# Patient Record
Sex: Male | Born: 1955 | Race: Black or African American | Hispanic: No | Marital: Single | Smoking: Former smoker
Health system: Southern US, Community
[De-identification: ages and names within clinical notes are randomized; demographics above are authoritative.]

## PROBLEM LIST (undated history)

## (undated) ENCOUNTER — Ambulatory Visit (HOSPITAL_COMMUNITY): Payer: Medicare HMO

## (undated) DIAGNOSIS — J45909 Unspecified asthma, uncomplicated: Secondary | ICD-10-CM

## (undated) DIAGNOSIS — J449 Chronic obstructive pulmonary disease, unspecified: Secondary | ICD-10-CM

## (undated) DIAGNOSIS — K7400 Hepatic fibrosis, unspecified: Secondary | ICD-10-CM

## (undated) DIAGNOSIS — K760 Fatty (change of) liver, not elsewhere classified: Secondary | ICD-10-CM

## (undated) DIAGNOSIS — D509 Iron deficiency anemia, unspecified: Secondary | ICD-10-CM

## (undated) DIAGNOSIS — F4310 Post-traumatic stress disorder, unspecified: Secondary | ICD-10-CM

## (undated) DIAGNOSIS — K219 Gastro-esophageal reflux disease without esophagitis: Secondary | ICD-10-CM

## (undated) DIAGNOSIS — H269 Unspecified cataract: Secondary | ICD-10-CM

## (undated) DIAGNOSIS — F1010 Alcohol abuse, uncomplicated: Secondary | ICD-10-CM

## (undated) DIAGNOSIS — F1910 Other psychoactive substance abuse, uncomplicated: Secondary | ICD-10-CM

## (undated) DIAGNOSIS — B1920 Unspecified viral hepatitis C without hepatic coma: Secondary | ICD-10-CM

## (undated) DIAGNOSIS — J439 Emphysema, unspecified: Secondary | ICD-10-CM

## (undated) DIAGNOSIS — T7840XA Allergy, unspecified, initial encounter: Secondary | ICD-10-CM

## (undated) HISTORY — DX: Hepatic fibrosis, unspecified: K74.00

## (undated) HISTORY — PX: OTHER SURGICAL HISTORY: SHX169

## (undated) HISTORY — DX: Allergy, unspecified, initial encounter: T78.40XA

## (undated) HISTORY — DX: Hepatic fibrosis: K74.0

## (undated) HISTORY — DX: Post-traumatic stress disorder, unspecified: F43.10

## (undated) HISTORY — DX: Chronic obstructive pulmonary disease, unspecified: J44.9

## (undated) HISTORY — DX: Other psychoactive substance abuse, uncomplicated: F19.10

## (undated) HISTORY — PX: MANDIBLE SURGERY: SHX707

## (undated) HISTORY — DX: Iron deficiency anemia, unspecified: D50.9

## (undated) HISTORY — DX: Fatty (change of) liver, not elsewhere classified: K76.0

## (undated) HISTORY — DX: Unspecified cataract: H26.9

## (undated) HISTORY — DX: Emphysema, unspecified: J43.9

## (undated) HISTORY — DX: Gastro-esophageal reflux disease without esophagitis: K21.9

---

## 2007-09-11 ENCOUNTER — Emergency Department (HOSPITAL_COMMUNITY): Admission: EM | Admit: 2007-09-11 | Discharge: 2007-09-11 | Payer: Self-pay | Admitting: Emergency Medicine

## 2008-03-03 ENCOUNTER — Emergency Department (HOSPITAL_COMMUNITY): Admission: EM | Admit: 2008-03-03 | Discharge: 2008-03-03 | Payer: Self-pay | Admitting: Emergency Medicine

## 2011-05-15 ENCOUNTER — Ambulatory Visit (INDEPENDENT_AMBULATORY_CARE_PROVIDER_SITE_OTHER): Payer: Medicaid Other | Admitting: Gastroenterology

## 2011-05-15 DIAGNOSIS — B182 Chronic viral hepatitis C: Secondary | ICD-10-CM

## 2011-05-15 LAB — PROTIME-INR
INR: 0.99 (ref <1.50)
Prothrombin Time: 13.5 seconds (ref 11.6–15.2)

## 2011-05-16 LAB — COMPLETE METABOLIC PANEL WITH GFR
Alkaline Phosphatase: 79 U/L (ref 39–117)
BUN: 12 mg/dL (ref 6–23)
CO2: 27 mEq/L (ref 19–32)
Creat: 0.77 mg/dL (ref 0.50–1.35)
GFR, Est African American: >89 mL/min
GFR, Est Non African American: >89 mL/min
Glucose, Bld: 82 mg/dL (ref 70–99)
Sodium: 139 mEq/L (ref 135–145)
Total Bilirubin: 0.4 mg/dL (ref 0.3–1.2)

## 2011-05-16 LAB — TSH: TSH: 1.416 u[IU]/mL (ref 0.350–4.500)

## 2011-05-16 LAB — IRON AND TIBC
%SAT: 25 % (ref 20–55)
Iron: 110 ug/dL (ref 42–165)
TIBC: 442 ug/dL — ABNORMAL HIGH (ref 215–435)
UIBC: 332 ug/dL (ref 125–400)

## 2011-05-16 LAB — CBC WITH DIFFERENTIAL/PLATELET
Basophils Relative: 1 % (ref 0–1)
Eosinophils Absolute: 0.1 10*3/uL (ref 0.0–0.7)
Eosinophils Relative: 3 % (ref 0–5)
Hemoglobin: 13.2 g/dL (ref 13.0–17.0)
Lymphs Abs: 1.6 10*3/uL (ref 0.7–4.0)
MCH: 31.7 pg (ref 26.0–34.0)
MCHC: 32.1 g/dL (ref 30.0–36.0)
MCV: 98.8 fL (ref 78.0–100.0)
Monocytes Absolute: 0.5 10*3/uL (ref 0.1–1.0)
Monocytes Relative: 12 % (ref 3–12)
Neutrophils Relative %: 49 % (ref 43–77)

## 2011-05-16 LAB — AFP TUMOR MARKER: AFP-Tumor Marker: 5 ng/mL (ref 0.0–8.0)

## 2011-05-16 LAB — FERRITIN: Ferritin: 286 ng/mL (ref 22–322)
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
## 2012-01-01 ENCOUNTER — Ambulatory Visit
Admission: RE | Admit: 2012-01-01 | Discharge: 2012-01-01 | Disposition: A | Discharge status: Home / Self Care | Payer: Medicaid Other | Source: Ambulatory Visit | Attending: Nephrology | Admitting: Nephrology

## 2012-01-01 ENCOUNTER — Other Ambulatory Visit: Payer: Self-pay | Admitting: Nephrology

## 2012-01-01 DIAGNOSIS — R05 Cough: Secondary | ICD-10-CM

## 2012-01-01 DIAGNOSIS — R059 Cough, unspecified: Secondary | ICD-10-CM

## 2012-01-28 ENCOUNTER — Other Ambulatory Visit: Payer: Self-pay | Admitting: Nephrology

## 2012-01-28 ENCOUNTER — Ambulatory Visit
Admission: RE | Admit: 2012-01-28 | Discharge: 2012-01-28 | Disposition: A | Discharge status: Home / Self Care | Payer: Medicaid Other | Source: Ambulatory Visit | Attending: Nephrology | Admitting: Nephrology

## 2012-01-28 DIAGNOSIS — R52 Pain, unspecified: Secondary | ICD-10-CM

## 2012-03-18 ENCOUNTER — Ambulatory Visit
Admission: RE | Admit: 2012-03-18 | Discharge: 2012-03-18 | Disposition: A | Discharge status: Home / Self Care | Payer: Medicaid Other | Source: Ambulatory Visit | Attending: Nephrology | Admitting: Nephrology

## 2012-03-18 ENCOUNTER — Other Ambulatory Visit: Payer: Self-pay | Admitting: Nephrology

## 2012-03-18 DIAGNOSIS — R52 Pain, unspecified: Secondary | ICD-10-CM

## 2012-05-11 ENCOUNTER — Encounter (HOSPITAL_COMMUNITY): Payer: Self-pay | Admitting: Emergency Medicine

## 2012-05-11 ENCOUNTER — Emergency Department (HOSPITAL_COMMUNITY): Payer: Medicaid Other

## 2012-05-11 ENCOUNTER — Emergency Department (HOSPITAL_COMMUNITY)
Admission: EM | Admit: 2012-05-11 | Discharge: 2012-05-11 | Disposition: A | Discharge status: Home / Self Care | Payer: Medicaid Other | Attending: Emergency Medicine | Admitting: Emergency Medicine

## 2012-05-11 DIAGNOSIS — W208XXA Other cause of strike by thrown, projected or falling object, initial encounter: Secondary | ICD-10-CM | POA: Insufficient documentation

## 2012-05-11 DIAGNOSIS — Z79899 Other long term (current) drug therapy: Secondary | ICD-10-CM | POA: Insufficient documentation

## 2012-05-11 DIAGNOSIS — Y929 Unspecified place or not applicable: Secondary | ICD-10-CM | POA: Insufficient documentation

## 2012-05-11 DIAGNOSIS — S0003XA Contusion of scalp, initial encounter: Secondary | ICD-10-CM | POA: Insufficient documentation

## 2012-05-11 DIAGNOSIS — J45909 Unspecified asthma, uncomplicated: Secondary | ICD-10-CM | POA: Insufficient documentation

## 2012-05-11 DIAGNOSIS — S0990XA Unspecified injury of head, initial encounter: Secondary | ICD-10-CM | POA: Insufficient documentation

## 2012-05-11 DIAGNOSIS — Y9389 Activity, other specified: Secondary | ICD-10-CM | POA: Insufficient documentation

## 2012-05-11 HISTORY — DX: Unspecified asthma, uncomplicated: J45.909

## 2012-05-11 MED ORDER — OXYCODONE-ACETAMINOPHEN 5-325 MG PO TABS
2.0000 | ORAL_TABLET | Freq: Once | ORAL | Status: AC
  Administered 2012-05-11: 2 via ORAL
  Filled 2012-05-11: qty 2

## 2012-05-11 MED ORDER — OXYCODONE-ACETAMINOPHEN 5-325 MG PO TABS: 2.0000 | ORAL_TABLET | Freq: Four times a day (QID) | ORAL | Status: DC | PRN
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
## 2012-05-11 NOTE — ED Notes (Signed)
Pt provided turkey sandwich and orange juice

## 2012-05-11 NOTE — ED Notes (Signed)
Patient transported to X-ray 

## 2012-05-11 NOTE — ED Notes (Signed)
Pt returned from xray

## 2012-05-11 NOTE — ED Notes (Signed)
Pt states tenderness to right side of head, right cheek bone. States plate to face in past. No swelling noted, painful to palpation. Skin intact.

## 2012-05-11 NOTE — ED Provider Notes (Signed)
Medical screening examination/treatment/procedure(s) were performed by non-physician practitioner and as supervising physician I was immediately available for consultation/collaboration.  Shelda Jakes, MD 05/11/12 [phone]

## 2012-05-11 NOTE — ED Notes (Signed)
Pt reports tried to put a couch in an elevator and the couch fell and hit him on the right side of head. Pt unsure if +LOC, reports, "feels like a light went out." Pt c/o pain to right side of head and jaw area. Pt has a metal plate with screws in his right jaw. Pain in jaw increases with jaw movement.

## 2012-06-20 ENCOUNTER — Encounter (HOSPITAL_COMMUNITY): Payer: Self-pay | Admitting: Emergency Medicine

## 2012-06-20 ENCOUNTER — Emergency Department (HOSPITAL_COMMUNITY)
Admission: EM | Admit: 2012-06-20 | Discharge: 2012-06-20 | Disposition: A | Discharge status: Home / Self Care | Payer: Medicaid Other | Source: Home / Self Care | Attending: Emergency Medicine | Admitting: Emergency Medicine

## 2012-06-20 DIAGNOSIS — M19021 Primary osteoarthritis, right elbow: Secondary | ICD-10-CM

## 2012-06-20 DIAGNOSIS — M19029 Primary osteoarthritis, unspecified elbow: Secondary | ICD-10-CM

## 2012-06-20 MED ORDER — MELOXICAM 15 MG PO TABS: 15.0000 mg | ORAL_TABLET | Freq: Every day | ORAL | Status: DC

## 2012-06-20 MED ORDER — TRAMADOL HCL 50 MG PO TABS: 100.0000 mg | ORAL_TABLET | Freq: Three times a day (TID) | ORAL | Status: DC | PRN

## 2012-06-20 NOTE — ED Notes (Signed)
Reports: right elbow pain. Over a year off/on. Hx of fracture. Unable to straighten arm.   In btwn doctors. Was told that he would need surgery.   Pt has x rays with him.
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
## 2012-06-26 ENCOUNTER — Inpatient Hospital Stay (HOSPITAL_COMMUNITY)
Admission: EM | Admit: 2012-06-26 | Discharge: 2012-06-27 | DRG: 392 | Disposition: A | Discharge status: Home / Self Care | Payer: Medicaid Other | Attending: Internal Medicine | Admitting: Internal Medicine

## 2012-06-26 ENCOUNTER — Encounter (HOSPITAL_COMMUNITY): Payer: Self-pay | Admitting: *Deleted

## 2012-06-26 DIAGNOSIS — K3189 Other diseases of stomach and duodenum: Secondary | ICD-10-CM | POA: Diagnosis present

## 2012-06-26 DIAGNOSIS — J449 Chronic obstructive pulmonary disease, unspecified: Secondary | ICD-10-CM | POA: Diagnosis present

## 2012-06-26 DIAGNOSIS — J45909 Unspecified asthma, uncomplicated: Secondary | ICD-10-CM | POA: Diagnosis present

## 2012-06-26 DIAGNOSIS — F1210 Cannabis abuse, uncomplicated: Secondary | ICD-10-CM | POA: Diagnosis present

## 2012-06-26 DIAGNOSIS — B1920 Unspecified viral hepatitis C without hepatic coma: Secondary | ICD-10-CM | POA: Diagnosis present

## 2012-06-26 DIAGNOSIS — Z23 Encounter for immunization: Secondary | ICD-10-CM

## 2012-06-26 DIAGNOSIS — F17200 Nicotine dependence, unspecified, uncomplicated: Secondary | ICD-10-CM | POA: Diagnosis present

## 2012-06-26 DIAGNOSIS — K319 Disease of stomach and duodenum, unspecified: Principal | ICD-10-CM | POA: Diagnosis present

## 2012-06-26 DIAGNOSIS — B182 Chronic viral hepatitis C: Secondary | ICD-10-CM | POA: Diagnosis present

## 2012-06-26 DIAGNOSIS — K625 Hemorrhage of anus and rectum: Secondary | ICD-10-CM | POA: Diagnosis present

## 2012-06-26 DIAGNOSIS — F1010 Alcohol abuse, uncomplicated: Secondary | ICD-10-CM | POA: Diagnosis present

## 2012-06-26 DIAGNOSIS — K769 Liver disease, unspecified: Secondary | ICD-10-CM | POA: Diagnosis present

## 2012-06-26 DIAGNOSIS — K922 Gastrointestinal hemorrhage, unspecified: Secondary | ICD-10-CM

## 2012-06-26 DIAGNOSIS — IMO0002 Reserved for concepts with insufficient information to code with codable children: Secondary | ICD-10-CM

## 2012-06-26 DIAGNOSIS — J4489 Other specified chronic obstructive pulmonary disease: Secondary | ICD-10-CM | POA: Diagnosis present

## 2012-06-26 DIAGNOSIS — K920 Hematemesis: Secondary | ICD-10-CM | POA: Diagnosis present

## 2012-06-26 DIAGNOSIS — K766 Portal hypertension: Secondary | ICD-10-CM | POA: Diagnosis present

## 2012-06-26 HISTORY — DX: Gastro-esophageal reflux disease without esophagitis: K21.9

## 2012-06-26 HISTORY — DX: Unspecified viral hepatitis C without hepatic coma: B19.20

## 2012-06-26 LAB — CBC WITH DIFFERENTIAL/PLATELET
Basophils Absolute: 0 10*3/uL (ref 0.0–0.1)
Basophils Relative: 0 % (ref 0–1)
Eosinophils Absolute: 0.1 10*3/uL (ref 0.0–0.7)
HCT: 41.6 % (ref 39.0–52.0)
MCH: 32.5 pg (ref 26.0–34.0)
MCHC: 35.3 g/dL (ref 30.0–36.0)
Monocytes Absolute: 0.5 10*3/uL (ref 0.1–1.0)
Monocytes Relative: 10 % (ref 3–12)
Neutro Abs: 3 10*3/uL (ref 1.7–7.7)
RDW: 12.9 % (ref 11.5–15.5)

## 2012-06-26 LAB — COMPREHENSIVE METABOLIC PANEL
AST: 90 U/L — ABNORMAL HIGH (ref 0–37)
Albumin: 4.5 g/dL (ref 3.5–5.2)
BUN: 11 mg/dL (ref 6–23)
Calcium: 10.4 mg/dL (ref 8.4–10.5)
Chloride: 101 mEq/L (ref 96–112)
Creatinine, Ser: 0.89 mg/dL (ref 0.50–1.35)
Total Bilirubin: 0.6 mg/dL (ref 0.3–1.2)
Total Protein: 9.6 g/dL — ABNORMAL HIGH (ref 6.0–8.3)

## 2012-06-26 LAB — OCCULT BLOOD GASTRIC / DUODENUM (SPECIMEN CUP): Occult Blood, Gastric: POSITIVE — AB

## 2012-06-26 LAB — TYPE AND SCREEN
ABO/RH(D): O POS
Antibody Screen: NEGATIVE

## 2012-06-26 LAB — TROPONIN I: Troponin I: <0.3 ng/mL (ref <0.30)

## 2012-06-26 LAB — CBC
HCT: 35 % — ABNORMAL LOW (ref 39.0–52.0)
Hemoglobin: 12.2 g/dL — ABNORMAL LOW (ref 13.0–17.0)
MCV: 90.7 fL (ref 78.0–100.0)
RDW: 12.9 % (ref 11.5–15.5)
WBC: 4.1 10*3/uL (ref 4.0–10.5)

## 2012-06-26 LAB — PROTIME-INR
INR: 0.98 (ref 0.00–1.49)
Prothrombin Time: 12.9 seconds (ref 11.6–15.2)

## 2012-06-26 LAB — ABO/RH: ABO/RH(D): O POS

## 2012-06-26 MED ORDER — MOMETASONE FURO-FORMOTEROL FUM 100-5 MCG/ACT IN AERO
2.0000 | INHALATION_SPRAY | Freq: Two times a day (BID) | RESPIRATORY_TRACT | Status: DC
  Administered 2012-06-26 – 2012-06-27 (×2): 2 via RESPIRATORY_TRACT
  Filled 2012-06-26: qty 8.8

## 2012-06-26 MED ORDER — ONDANSETRON HCL 4 MG/2ML IJ SOLN
4.0000 mg | Freq: Once | INTRAMUSCULAR | Status: AC
  Administered 2012-06-26: 4 mg via INTRAVENOUS
  Filled 2012-06-26: qty 2

## 2012-06-26 MED ORDER — PANTOPRAZOLE SODIUM 40 MG IV SOLR
40.0000 mg | Freq: Two times a day (BID) | INTRAVENOUS | Status: DC
  Administered 2012-06-26 – 2012-06-27 (×2): 40 mg via INTRAVENOUS
  Filled 2012-06-26 (×3): qty 40

## 2012-06-26 MED ORDER — ONDANSETRON HCL 4 MG PO TABS: 4.0000 mg | ORAL_TABLET | Freq: Four times a day (QID) | ORAL | Status: DC | PRN

## 2012-06-26 MED ORDER — SODIUM CHLORIDE 0.9 % IV SOLN
INTRAVENOUS | Status: DC
  Administered 2012-06-26 – 2012-06-27 (×2): via INTRAVENOUS
  Administered 2012-06-27: 500 mL via INTRAVENOUS

## 2012-06-26 MED ORDER — ONDANSETRON HCL 4 MG/2ML IJ SOLN: 4.0000 mg | Freq: Four times a day (QID) | INTRAMUSCULAR | Status: DC | PRN

## 2012-06-26 MED ORDER — ALBUTEROL SULFATE HFA 108 (90 BASE) MCG/ACT IN AERS
2.0000 | INHALATION_SPRAY | Freq: Four times a day (QID) | RESPIRATORY_TRACT | Status: DC | PRN
  Filled 2012-06-26: qty 6.7

## 2012-06-26 MED ORDER — PANTOPRAZOLE SODIUM 40 MG IV SOLR
40.0000 mg | Freq: Once | INTRAVENOUS | Status: AC
  Administered 2012-06-26: 40 mg via INTRAVENOUS
  Filled 2012-06-26: qty 40

## 2012-06-26 MED ORDER — MORPHINE SULFATE 2 MG/ML IJ SOLN
2.0000 mg | INTRAMUSCULAR | Status: DC | PRN
  Administered 2012-06-27: 2 mg via INTRAVENOUS
  Filled 2012-06-26: qty 1

## 2012-06-26 MED ORDER — DOCUSATE SODIUM 100 MG PO CAPS
100.0000 mg | ORAL_CAPSULE | Freq: Every day | ORAL | Status: DC
  Administered 2012-06-27: 100 mg via ORAL
  Filled 2012-06-26: qty 1

## 2012-06-26 MED ORDER — GI COCKTAIL ~~LOC~~
30.0000 mL | Freq: Once | ORAL | Status: AC
  Administered 2012-06-26: 30 mL via ORAL
  Filled 2012-06-26: qty 30

## 2012-06-26 NOTE — H&P (Signed)
Medical Student Hospital Admission Note Date: 06/26/2012  Patient name: Dylan Duncan Medical record number: [id number] Date of birth: 07-Nov-1955 Age: 57 y.o. Gender: male PCP: Default, Provider, MD  Medical Service: Internal Medicine Teaching Service  Attending physician: Dr. Eben Burow     Chief Complaint: vomiting blood  History of Present Illness: Mr. Linch is a 57 yo male with a history of COPD, Hepatitis C and non-bleeding ulcer who presents to the ED after vomiting blood 3 times this morning.  Describes the blood as bright red.  States that he has had 4 episodes total with one being in the ED.  The amount of blood as decreased with each episode of vomiting.  He states that he did not try anything to improve his symptoms and nothing has made it better.  Nothing has made his symptoms worse.  He does not recall anything like this has happening in the past.  During this time period he has also had chills and sweating.  He denies any fever, abdominal or chest pain.  States he has had bright red blood per rectum but no blood in the stools.    Meds: Current Outpatient Rx  Name  Route  Sig  Dispense  Refill  . albuterol (PROVENTIL HFA;VENTOLIN HFA) 108 (90 BASE) MCG/ACT inhaler   Inhalation   Inhale 2 puffs into the lungs every 6 (six) hours as needed for wheezing.         . Fluticasone-Salmeterol (ADVAIR) 250-50 MCG/DOSE AEPB   Inhalation   Inhale 1 puff into the lungs every 12 (twelve) hours.         Marland Kitchen HYDROcodone-acetaminophen (NORCO) 10-325 MG per tablet   Oral   Take 1 tablet by mouth every 8 (eight) hours as needed for pain.         . pantoprazole (PROTONIX) 40 MG tablet   Oral   Take 40 mg by mouth daily.           Allergies: Allergies as of 06/26/2012 - Review Complete 06/26/2012  Allergen Reaction Noted  . Lactose intolerance (gi)  05/11/2012  . Penicillins Itching 05/11/2012   Past Medical History  Diagnosis Date  . Asthma   . 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 Acid reflux    Past Surgical History  Procedure Laterality Date  . Mandible surgery    . Arm surgery Right    History reviewed. No pertinent family history. History   Social History  . Marital Status: Single    Spouse Name: N/A    Number of Children: N/A  . Years of Education: N/A   Occupational History  . Not on file.   Social History Main Topics  . Smoking status: Current Every Day Smoker -- 0.14 packs/day for 4 years  . Smokeless tobacco: Not on file  . Alcohol Use: .6 - 1.2 oz/week    1-2 Cans of beer per week     Comment: former drinker  . Drug Use: 2.00 per week    Special: Marijuana  . Sexually Active: Yes    Birth Control/ Protection: Condom   Other Topics Concern  . Not on file   Social History Narrative  . No narrative on file    Review of Systems: Review of Systems  Constitutional: Positive for chills and diaphoresis. Negative for fever.  HENT: Negative for hearing loss and sore throat.   Eyes: Negative for blurred vision,  double vision and photophobia.  Respiratory: Positive for sputum production and shortness of breath. Negative for cough and hemoptysis.   Cardiovascular: Negative for chest pain and leg swelling.  Gastrointestinal: Positive for nausea and vomiting. Negative for abdominal pain, diarrhea, constipation and blood in stool.  Genitourinary: Negative for dysuria and flank pain.  Musculoskeletal: Positive for back pain.  Skin: Negative for rash.  Neurological: Negative for dizziness, tingling and headaches.   Physical Exam: Blood pressure 135/88,  pulse 75,  temperature 98.8 F (37.1 C),  temperature source Oral,  resp. rate 20,  SpO2 100.00%.  GENERAL: well developed, well nourished; no acute distress HEAD: atraumatic, normocephalic EYES: pupils equal, round and reactive; sclera anicteric NOSE:mild and clear drainage MOUTH/THROAT: oropharynx clear, moist mucous  membranes NECK: supple, no carotid bruits, thyroid normal in size and without palpable nodules LYMPH: left submandibular lymphadenopathy on a single node, no cervical or supraclavicular lymphadenopathy LUNGS: no crackles, rhonchi and wheezing present throughout, normal work of breathing HEART: normal rate and regular rhythm; normal S1 and S2 without S3 or S4; no murmurs, rubs, or clicks PULSES: radial 2+ and symmetric ABDOMEN: soft, non-tender, normal bowel sounds: CRANIAL NERVES: pupils reactive to light bilaterally; extra occular muscles are intact; facial sensation is intact and equal bilaterally in V1, V2, and V3, hearing is equal bilaterally, uvula is midline and palate elevates symmetrically; tongue protrudes midline SKIN: warm, dry, intact, normal turgor, no rashes EXTREMITIES: no peripheral edema, clubbing, or cyanosis  Lab results: Basic Metabolic Panel:  Recent Labs  16/10/96 1212  NA 138  K 3.9  CL 101  CO2 26  GLUCOSE 90  BUN 11  CREATININE 0.89  CALCIUM 10.4   Liver Function Tests:  Recent Labs  06/26/12 1212  AST 90*  ALT 65*  ALKPHOS 82  BILITOT 0.6  PROT 9.6*  ALBUMIN 4.5   CBC:  Recent Labs  06/26/12 1212  WBC 5.3  NEUTROABS 3.0  HGB 14.7  HCT 41.6  MCV 91.8  PLT 139*   Cardiac Enzymes:  Recent Labs  06/26/12 1231  TROPONINI <0.30   Coagulation:  Recent Labs  06/26/12 1212  LABPROT 12.9  INR 0.98    Assessment & Plan by Problem: Mr. Molden is a 57 yo male with a history of COPD, Hepatitis C and non-bleeding ulcer who presents to the ED after vomiting blood 3 times this morning.  1. Hematemesis - DDx includes Bleeding ulcer, variceal bleeding, mallory-weiss tear or pancreatitis.  Given HPI this is likely a bleeding ulcer since he states there was less blood with each episode and the fact that he is hemodynamically stable.  Variceal bleeding is also likely given his history of hepatitis C and chronic alcohol use.  Alcohol use makes  mallory-weiss possible but less likely given that he states his last drink was over a week ago.  Bleeding from perforation of pancreatic vessels could also be a less likely cause of his bleeding although he does not complain of epigastric pain radiating to his back.  FOBT negative.  Plan: -Consult Gastroenterology -EGD to look for ulcer or varices -CBC tomorrow morning -Serum Lipase  2. Hepatitis C - AST = 90, ALT = 65.  PT normal to 12.9.  Last known viral load was 11/2010 and quantified as 762K.  Since then patient has not received treatment because of chronic alcohol and marijuana use.  Patient was given 1st Hep A vaccine and is due to receive 2nd in August 2014.  3. Asthma related COPD -  Stable.  Patient complains of some SOB.  O2 sats 100%.  Goal > 89%.  Plan is to continue home regimen of Mometasone-fomrmoterol and albuterol.  4. F/E/N -IV NS -Replace electrolytes as needed -Clear liquid today, NPO after midnight  5. Prophylaxis - SCDs  Dispo:    This is a Psychologist, occupational Note.  The care of the patient was discussed with Dr. Earlene Plater and the assessment and plan was formulated with their assistance.  Please see their note for official documentation of the patient encounter.   Signed: Merla Riches 06/26/2012, 3:57 PM    Addendum to medical student daily progress note:  I have seen the patient and reviewed the daily progress note by Jacqualine Mau and discussed the care of the patient with him. See below for documentation of my findings, assessment, and plans.   Subjective:    Interval Events:  I agree with above documentation.  New onset hematemesis this AM.    Objective:    Vital Signs:  Above vital signs were reviewed.   Physical Exam: GENERAL:  alert and oriented; resting comfortably in bed and in no distress EYES:  pupils equal, round, and reactive to light; sclera anicteric ENT:  moist mucosa, no blood visible NECK:  no JVD, no lymphadenopathy LUNGS:   Normal effort, expiratory wheezes throughout, no rales HEART:  normal rate; regular rhythm; normal S1 and S2, no S3 or S4 appreciated; no murmurs, rubs, or clicks ABDOMEN:  soft, mild tenderness with palpation of the epigastrium, normal bowel sounds, no masses palpated EXTREMITIES:  no edema SKIN:  normal turgor, no rashes PULSES: 2+ radial and dorsalis pedis     Labs:  Above labs and radiographic studies were reviewed.    Assessment/ Plan:    I agree with the above assessment and plan.  1. Hematemesis: Most likely from bleeding esophageal varices, EGD by gastroenterology tomorrow, Q8 CBCs  2. Chronic hepatitis C, likely portal hypertension  3. COPD    Length of Stay: 0 days   Signed by:  Dorthula Rue. Earlene Plater, MD PGY-I, Internal Medicine Pager [phone]  06/26/2012, 5:07 PM

## 2012-06-26 NOTE — Progress Notes (Signed)
Patient arrived to 74. Patient was alert, having no pain or nausea/vomitting. Patient is stable and will continue to be monitored.

## 2012-06-26 NOTE — ED Provider Notes (Signed)
History     CSN: [id number]  Arrival date & time 06/26/12  1146   First MD Initiated Contact with Patient 06/26/12 1204      Chief Complaint  Patient presents with  . Hematemesis    (Consider location/radiation/quality/duration/timing/severity/associated sxs/prior treatment) HPI Comments: Patient comes to the ER for evaluation of vomiting blood. Patient reports that he woke this morning not feeling well. He has nausea and then he had 2 episodes of vomiting red blood. Patient reports that he does have a history of ulcer, but has never had any GI bleeding.   Past Medical History  Diagnosis Date  . Asthma   . Hepatitis C   . Acid reflux     Past Surgical History  Procedure Laterality Date  . Mandible surgery    . Arm surgery Right     History reviewed. No pertinent family history.  History  Substance Use Topics  . Smoking status: Never Smoker   . Smokeless tobacco: Not on file  . Alcohol Use: No     Comment: former drinker      Review of Systems  Cardiovascular: Negative for chest pain.  Gastrointestinal: Positive for nausea, vomiting and abdominal pain.  All other systems reviewed and are negative.    Allergies  Lactose intolerance (gi) and Penicillins  Home Medications   Current Outpatient Rx  Name  Route  Sig  Dispense  Refill  . albuterol (PROVENTIL HFA;VENTOLIN HFA) 108 (90 BASE) MCG/ACT inhaler   Inhalation   Inhale 2 puffs into the lungs every 6 (six) hours as needed for wheezing.         . Fluticasone-Salmeterol (ADVAIR) 250-50 MCG/DOSE AEPB   Inhalation   Inhale 1 puff into the lungs every 12 (twelve) hours.         . meloxicam (MOBIC) 15 MG tablet   Oral   Take 1 tablet (15 mg total) by mouth daily.   15 tablet   0   . oxyCODONE-acetaminophen (PERCOCET/ROXICET) 5-325 MG per tablet   Oral   Take 2 tablets by mouth every 6 (six) hours as needed for pain.   15 tablet   0   . traMADol (ULTRAM) 50 MG tablet   Oral   Take 2 tablets  (100 mg total) by mouth every 8 (eight) hours as needed for pain.   30 tablet   0     BP 126/83  Pulse 95  Temp(Src) 98.8 F (37.1 C) (Oral)  Resp 22  SpO2 100%  Physical Exam  Constitutional: He is oriented to person, place, and time. He appears well-developed and well-nourished. No distress.  HENT:  Head: Normocephalic and atraumatic.  Right Ear: Hearing normal.  Nose: Nose normal.  Mouth/Throat: Oropharynx is clear and moist and mucous membranes are normal.  Eyes: Conjunctivae and EOM are normal. Pupils are equal, round, and reactive to light.  Neck: Normal range of motion. Neck supple.  Cardiovascular: Normal rate, regular rhythm, S1 normal and S2 normal.  Exam reveals no gallop and no friction rub.   No murmur heard. Pulmonary/Chest: Effort normal and breath sounds normal. No respiratory distress. He exhibits no tenderness.  Abdominal: Soft. Normal appearance and bowel sounds are normal. There is no hepatosplenomegaly. There is no tenderness. There is no rebound, no guarding, no tenderness at McBurney's point and negative Murphy's sign. No hernia.  Musculoskeletal: Normal range of motion.  Neurological: He is alert and oriented to person, place, and time. He has normal strength. No cranial nerve deficit  or sensory deficit. Coordination normal. GCS eye subscore is 4. GCS verbal subscore is 5. GCS motor subscore is 6.  Skin: Skin is warm, dry and intact. No rash noted. No cyanosis.  Psychiatric: He has a normal mood and affect. His speech is normal and behavior is normal. Thought content normal.    ED Course  Procedures (including critical care time)  Labs Reviewed  CBC WITH DIFFERENTIAL - Abnormal; Notable for the following:    Platelets 139 (*)    All other components within normal limits  COMPREHENSIVE METABOLIC PANEL - Abnormal; Notable for the following:    Total Protein 9.6 (*)    AST 90 (*)    ALT 65 (*)    All other components within normal limits  OCCULT BLOOD  GASTRIC / DUODENUM (SPECIMEN CUP) - Abnormal; Notable for the following:    Occult Blood, Gastric POSITIVE (*)    All other components within normal limits  TROPONIN I  PROTIME-INR  OCCULT BLOOD, POC DEVICE  POCT GASTRIC OCCULT BLOOD (1-CARD TO LAB)  TYPE AND SCREEN  ABO/RH   No results found.   Diagnosis: Upper GI bleed    MDM  This presents to the ER for evaluation of hematemesis. Patient reports that he was feeling nauseated with upper abdominal discomfort and then had vomiting. He reports that he vomited bright red blood. This has happened one more time prior to coming to the ER. Patient has a history of hepatitis C. He also has a history of alcohol abuse. No known history of varices. He does report he has had a previous history of gastric ulcer.  His vital signs have remained stable. His hemoglobin is normal. He did have one episode of emesis here in the ER but was not grossly bloody but was Hemoccult-positive. Rectal exam was heme negative. This appears to be an acute onset upper GI bleed. Patient has a history of ulcer and also has potential for varices secondary to liver disease. He will therefore be admitted to the hospital. Discussed with Dr. [name], will consult and perform endoscopy in the morning.        Gilda Crease, MD 06/26/12 1459

## 2012-06-26 NOTE — ED Notes (Signed)
Reports not feeling well this am and then vomited blood x 2.

## 2012-06-26 NOTE — ED Notes (Signed)
DR.Pollina given copy of ecg, no old ecg found in chart.

## 2012-06-27 ENCOUNTER — Encounter (HOSPITAL_COMMUNITY): Payer: Self-pay

## 2012-06-27 ENCOUNTER — Encounter (HOSPITAL_COMMUNITY)
Admission: EM | Disposition: A | Discharge status: Home / Self Care | Payer: Self-pay | Source: Home / Self Care | Attending: Internal Medicine

## 2012-06-27 DIAGNOSIS — K3189 Other diseases of stomach and duodenum: Secondary | ICD-10-CM | POA: Diagnosis present

## 2012-06-27 HISTORY — PX: ESOPHAGOGASTRODUODENOSCOPY: SHX5428

## 2012-06-27 LAB — HIV ANTIBODY (ROUTINE TESTING W REFLEX): HIV: NONREACTIVE

## 2012-06-27 LAB — COMPREHENSIVE METABOLIC PANEL
Albumin: 3.3 g/dL — ABNORMAL LOW (ref 3.5–5.2)
Alkaline Phosphatase: 59 U/L (ref 39–117)
BUN: 10 mg/dL (ref 6–23)
Calcium: 9.2 mg/dL (ref 8.4–10.5)
Potassium: 3.4 mEq/L — ABNORMAL LOW (ref 3.5–5.1)
Sodium: 135 mEq/L (ref 135–145)
Total Protein: 7.3 g/dL (ref 6.0–8.3)

## 2012-06-27 LAB — CBC
HCT: 34.9 % — ABNORMAL LOW (ref 39.0–52.0)
Hemoglobin: 12.1 g/dL — ABNORMAL LOW (ref 13.0–17.0)
MCH: 31.3 pg (ref 26.0–34.0)
MCHC: 34.7 g/dL (ref 30.0–36.0)
MCV: 90.4 fL (ref 78.0–100.0)

## 2012-06-27 SURGERY — EGD (ESOPHAGOGASTRODUODENOSCOPY)
Anesthesia: Moderate Sedation

## 2012-06-27 MED ORDER — BUTAMBEN-TETRACAINE-BENZOCAINE 2-2-14 % EX AERO
INHALATION_SPRAY | CUTANEOUS | Status: DC | PRN
  Administered 2012-06-27: 2 via TOPICAL

## 2012-06-27 MED ORDER — PANTOPRAZOLE SODIUM 40 MG PO TBEC: 40.0000 mg | DELAYED_RELEASE_TABLET | Freq: Two times a day (BID) | ORAL | Status: DC

## 2012-06-27 MED ORDER — FENTANYL CITRATE 0.05 MG/ML IJ SOLN
INTRAMUSCULAR | Status: DC | PRN
  Administered 2012-06-27 (×5): 25 ug via INTRAVENOUS

## 2012-06-27 MED ORDER — MIDAZOLAM HCL 10 MG/2ML IJ SOLN
INTRAMUSCULAR | Status: DC | PRN
  Administered 2012-06-27 (×2): 2 mg via INTRAVENOUS
  Administered 2012-06-27: 1 mg via INTRAVENOUS
  Administered 2012-06-27 (×2): 2 mg via INTRAVENOUS

## 2012-06-27 MED ORDER — DIPHENHYDRAMINE HCL 50 MG/ML IJ SOLN
INTRAMUSCULAR | Status: AC
  Filled 2012-06-27: qty 1

## 2012-06-27 MED ORDER — MIDAZOLAM HCL 5 MG/ML IJ SOLN
INTRAMUSCULAR | Status: AC
  Filled 2012-06-27: qty 2

## 2012-06-27 MED ORDER — FENTANYL CITRATE 0.05 MG/ML IJ SOLN
INTRAMUSCULAR | Status: AC
  Filled 2012-06-27: qty 4

## 2012-06-27 MED ORDER — DIPHENHYDRAMINE HCL 50 MG/ML IJ SOLN
INTRAMUSCULAR | Status: DC | PRN
  Administered 2012-06-27 (×2): 12.5 mg via INTRAVENOUS

## 2012-06-27 NOTE — Progress Notes (Signed)
Medical Student Daily Progress Note   Subjective:    Interval Events:  No acute events overnight.  Patient states he did not sleep last night and was irritable this morning.  He stated he wanted to leave AMA but after talking through his plan with Dr. Manson Passey, he agreed to stay for his procedure today.  Denies shortness of breath, nausea and vomiting.   Objective:    Vital Signs:   Temp:  [98.6 F (37 C)-99.2 F (37.3 C)]     98.6 F (37 C) (04/13 0528) Pulse Rate:  [65-95]        65 (04/13 0528) Resp:  [20-22]        20 (04/13 0528) BP: (109-139)/(63-88)       109/63 mmHg (04/13 0528) SpO2:  [99 %-100 %]        99 % (04/13 0810) Weight:  [56.6 kg (124 lb 12.5 oz)]      56.6 kg (124 lb 12.5 oz) (04/12 2018) Last BM Date: 06/25/12   Physical Exam: GENERAL: well developed, well nourished; no acute distress  HEAD: atraumatic, normocephalic  EYES: sclera anicteric   MOUTH/THROAT: oropharynx clear, moist mucous membranes  LYMPH: left submandibular lymphadenopathy on a single node, no cervical or supraclavicular lymphadenopathy  LUNGS: no crackles, rhonchi but mild wheezing present throughout, normal work of breathing  HEART: normal rate and regular rhythm; normal S1 and S2 without S3 or S4; no murmurs, rubs, or clicks  SKIN: warm, dry, intact, normal turgor, no rashes  EXTREMITIES: no peripheral edema, clubbing, or cyanosis   Labs: Basic Metabolic Panel:  Recent Labs Lab 06/26/12 1212 06/27/12 0450  NA 138 135  K 3.9 3.4*  CL 101 102  CO2 26 25  GLUCOSE 90 138*  BUN 11 10  CREATININE 0.89 0.81  CALCIUM 10.4 9.2    Liver Function Tests:  Recent Labs Lab 06/26/12 1212 06/27/12 0450  AST 90* 59*  ALT 65* 46  ALKPHOS 82 59  BILITOT 0.6 0.6  PROT 9.6* 7.3  ALBUMIN 4.5 3.3*    Recent Labs Lab 06/26/12 1231  LIPASE 33   CBC:  Recent Labs Lab 06/26/12 1212 06/26/12 2045 06/27/12 0450  WBC 5.3 4.1 3.7*  NEUTROABS 3.0  --   --   HGB 14.7 12.2* 12.1*  HCT  41.6 35.0* 34.9*  MCV 91.8 90.7 90.4  PLT 139* 110* 106*    Coagulation Studies:  Recent Labs  06/26/12 1212  LABPROT 12.9  INR 0.98     Medications:    Infusions: . sodium chloride 75 mL/hr at 06/26/12 1722     Scheduled Medications: . docusate sodium  100 mg Oral Daily  . mometasone-formoterol  2 puff Inhalation BID  . pantoprazole (PROTONIX) IV  40 mg Intravenous Q12H     PRN Medications: albuterol, morphine injection, ondansetron (ZOFRAN) IV, ondansetron    Assessment/ Plan:    Mr. Christoffel is a 57 yo male with a history of COPD, Hepatitis C and non-bleeding ulcer who presents to the ED after vomiting blood 3 times this morning.   1. Hematemesis - DDx includes Bleeding ulcer, variceal bleeding, mallory-weiss tear. Given HPI this is likely a bleeding ulcer since he states there was less blood with each episode and the fact that he is hemodynamically stable. Variceal bleeding is also likely given his history of hepatitis C and chronic alcohol use. Alcohol use makes mallory-weiss possible but less likely given that he states his last drink was over a week ago.  Bleeding from perforation of pancreatic vessels could also be a less likely cause of his bleeding although he does not complain of epigastric pain radiating to his back. FOBT negative.   Plan:  -EGD this morning to look for ulcer or varices  -CBC q8  2. Hepatitis C - AST = 90, ALT = 65. PT normal to 12.9. Last known viral load was 11/2010 and quantified as 762K. Since then patient has not received treatment because of chronic alcohol and marijuana use. Patient was given first Hep A vaccine and is due to receive Second in August 2014.   3. Asthma related COPD - Stable. Patient complains of some SOB. O2 sats 100%. Goal > 89%. Plan is to continue home regimen of Mometasone-fomrmoterol and albuterol.   4. F/E/N  -IV NS  -Replace electrolytes as needed  -Clear liquid today, NPO after midnight   5. Prophylaxis - SCDs     Length of Stay: 1 days   This is a Psychologist, occupational Note.  The care of the patient was discussed with Dr. Earlene Plater and the assessment and plan formulated with their assistance.  Please see their attached note or addendum for official documentation of the daily encounter.   Addendum to medical student daily progress note:  I have seen the patient and reviewed the daily progress note by Jacqualine Mau and discussed the care of the patient with him. See below for documentation of my findings, assessment, and plans.   Subjective:    Interval Events:  I agree with above documentation.  No more hematemesis.    Objective:    Vital Signs:  Above vital signs were reviewed.   Physical Exam: GENERAL:  alert and oriented; resting comfortably in bed and in no distress LUNGS:  clear to auscultation bilaterally, normal work of breathing HEART:  normal rate; regular rhythm; normal S1 and S2, no S3 or S4 appreciated; no murmurs, rubs, or clicks ABDOMEN:  soft, slightly tender with palpation EXTREMITIES:  no edema SKIN:  normal turgor     Labs:  Above labs and radiographic studies were reviewed.    Assessment/ Plan:    I agree with the above assessment and plan.  1. Hematemesis: Gastroenterology to perform EGD this morning.  2. Chronic HCV: Patient saw a hepatologist at [facility] but is not following with them currently. Not on treatment.  3. Asthma/COPD: Stable on home regimen.     Length of Stay: 1 days   Signed by:  Dorthula Rue. Earlene Plater, MD PGY-I, Internal Medicine Pager [phone]  06/27/2012, 10:48 AM

## 2012-06-27 NOTE — H&P (Signed)
Internal Medicine Attending Admission Note Date: 06/27/2012  Patient name: NICKLOUS ABURTO Medical record number: [id number] Date of birth: 08-27-1955 Age: 57 y.o. Gender: male  I saw and evaluated the patient. I reviewed the resident's note and I agree with the resident's findings and plan as documented in the resident's note.  Chief Complaint(s): hematemesis  History - key components related to admission:Mr. Miles is a 57 yo male with a history of COPD, Hepatitis C and non-bleeding ulcer who presented to the ER after vomiting blood 3 times on the morning of admission. Described bright red blood. He cannot quantify the amount of blood but says that the amount decreased with every successive vomit. Nothing has made his symptoms worse. This is his first episode. During this time period he has also had chills and sweating. He denies any fever, abdominal or chest pain. States he has had bright red blood per rectum but no blood in the stools.   Patient already had EDG when I saw him this morning in the recovery room.  15 point review of systems negative except as noted above.  Past medical history, past surgical history, medications, social history and family history was reviewed and is as per resident's note.  Physical Exam - key components related to admission:  Filed Vitals:   06/27/12 1156 06/27/12 1200 06/27/12 1210 06/27/12 1220  BP: 103/60 103/60 104/61 104/63  Pulse:      Temp:      TempSrc:      Resp: 15 17 14 18   Height:      Weight:      SpO2: 99% 99% 99% 99%  Physical Exam: General: Vital signs reviewed and noted. Well-developed, well-nourished, in no acute distress; alert, appropriate and cooperative throughout examination.  Head: Normocephalic, atraumatic.  Eyes: PERRL, EOMI, No signs of anemia or jaundince.  Nose: Mucous membranes moist, not inflammed, nonerythematous.  Throat: Oropharynx nonerythematous, no exudate appreciated.   Neck: No deformities, masses, or  tenderness noted.Supple, No carotid Bruits, no JVD.  Lungs:  Normal respiratory effort. Clear to auscultation BL without crackles or wheezes.  Heart: RRR. S1 and S2 normal without gallop, murmur, or rubs.  Abdomen:  BS normoactive. Soft, Nondistended, non-tender.  No masses or organomegaly.  Extremities: No pretibial edema.  Neurologic: A&O X3, CN II - XII are grossly intact. Motor strength is 5/5 in the all 4 extremities, Sensations intact to light touch, Cerebellar signs negative.  Skin: No visible rashes, scars.    Lab results:   Basic Metabolic Panel:  Recent Labs  13/08/65 1212 06/27/12 0450  NA 138 135  K 3.9 3.4*  CL 101 102  CO2 26 25  GLUCOSE 90 138*  BUN 11 10  CREATININE 0.89 0.81  CALCIUM 10.4 9.2   Liver Function Tests:  Recent Labs  06/26/12 1212 06/27/12 0450  AST 90* 59*  ALT 65* 46  ALKPHOS 82 59  BILITOT 0.6 0.6  PROT 9.6* 7.3  ALBUMIN 4.5 3.3*    Recent Labs  06/26/12 1231  LIPASE 33   CBC:  Recent Labs  06/26/12 1212 06/26/12 2045 06/27/12 0450  WBC 5.3 4.1 3.7*  NEUTROABS 3.0  --   --   HGB 14.7 12.2* 12.1*  HCT 41.6 35.0* 34.9*  MCV 91.8 90.7 90.4  PLT 139* 110* 106*   Cardiac Enzymes:  Recent Labs  06/26/12 1231  TROPONINI <0.30    Coagulation:  Recent Labs  06/26/12 1212  INR 0.98   Imaging results:  No  results found.  Assessment & Plan by Problem:  Principal Problem:   Hematemesis Active Problems:   Hepatitis C   Asthma   Portal hypertensive gastropathy   Mr. Yagi is a 57 yo male with a history of COPD, Hepatitis C and non-bleeding ulcer who presents to the ED after vomiting blood 3 times this morning. Differentials included Bleeding ulcer, variceal bleeding, mallory-weiss tear or pancreatitis. Patient had an EGD done already which was suggestive of gastric antral ulcer. Chronic alcohol use is most likely cause. Dr. [name] has recommended proton pump inhibitors and he will see him in the outpatient  setting.  We will advance diet at discharge patient today once he is able to tolerate diet.  Lars Mage MD Faculty-Internal Medicine Residency Program

## 2012-06-27 NOTE — Op Note (Signed)
[facility] [address] [place], [address]   ENDOSCOPY PROCEDURE REPORT  PATIENT: Dylan, Duncan  MR#: [id number] BIRTHDATE: [birth year] , 56  yrs. old GENDER: Male  ENDOSCOPIST: Vida Rigger, MD REFERRED BY:  PROCEDURE DATE:  06/27/2012 PROCEDURE:   EGD, diagnostic ASA CLASS:   Class II INDICATIONS:Hematemesis.  MEDICATIONS: Benadryl 25 mg IV, Fentanyl 125 mcg IV, and Versed 9 mg IV  TOPICAL ANESTHETIC:  DESCRIPTION OF PROCEDURE:   After the risks benefits and alternatives of the procedure were thoroughly explained, informed consent was obtained.  The Pentax Gastroscope [id number]  endoscope was introduced through the mouth and advanced to the second portion of the duodenum , limited by Without limitations.   The instrument was slowly withdrawn as the mucosa was fully examined.the findings are recorded below and the patient tolerated the procedure adequately there was no obvious immediate and no signs of active bleeding         FINDINGS: 1. Probable bleeding from mild proximal portal gastropathy 2. Otherwise within normal limits EGD  COMPLICATIONS:none  ENDOSCOPIC IMPRESSION:above   RECOMMENDATIONS:advance diet questionably home later today and warn about no aspirin or nonsteroidals and to stop alcohol and continue pump inhibitors and consider beta blockers in the future particularly if he rebleeds from this cause and I would do an outpatient ultrasound to reevaluate his liver disease and I'm happy to see back when necessary   REPEAT EXAM: as needed   _______________________________ Vida Rigger, MD eSigned:  Vida Rigger, MD 06/27/2012 12:01 PM    CC:  PATIENT NAME:  Dylan, Duncan MR#: [id number]

## 2012-06-27 NOTE — Discharge Summary (Signed)
Patient Name:  Dylan Duncan MRN: [id number]  PCP: Provider Default, MD DOB:  [birth year]       Date of Admission:  06/26/2012  Date of Discharge:  06/27/2012      Attending Physician: Lars Mage, MD         DISCHARGE DIAGNOSES: 1. Portal gastropathy and hematemesis:  Increase PPI to BID 2. Chronic hepatitis C virus 3. Asthma/COPD    DISPOSITION AND FOLLOW-UP: TATEM FESLER is to follow-up with the listed providers as detailed below, at which time, the following should be addressed:  1. Follow-up visits: 1. Portal gastropathy:  GI recommends follow up liver ultrasound.  They also suggest beta-blocker therapy in the future.  Patient needs to abstain from NSAIDs and EtOH.  2. Labs and images needed: 1. Liver Ultrasound  3. Pending labs and tests needing follow-up: 1. HIV, screening   Follow-up Information   Follow up with [name], MD. Schedule an appointment as soon as possible for a visit in 1 week. (For hospital follow-up)    Contact information:   [address] [place] [address] [phone]      Discharge Orders   Future Orders Complete By Expires     Call MD for:  difficulty breathing, headache or visual disturbances  As directed     Call MD for:  extreme fatigue  As directed     Call MD for:  persistant dizziness or light-headedness  As directed     Call MD for:  persistant nausea and vomiting  As directed     Call MD for:  severe uncontrolled pain  As directed     Call MD for:  temperature >100.4  As directed     Diet - low sodium heart healthy  As directed     Driving Restrictions  As directed     Comments:      No driving until 04/30/863    Increase activity slowly  As directed         DISCHARGE MEDICATIONS:   Medication List    TAKE these medications       albuterol 108 (90 BASE) MCG/ACT inhaler  Commonly known as:  PROVENTIL HFA;VENTOLIN HFA  Inhale 2 puffs into the lungs every 6 (six) hours as needed for wheezing.       Fluticasone-Salmeterol 250-50 MCG/DOSE Aepb  Commonly known as:  ADVAIR  Inhale 1 puff into the lungs every 12 (twelve) hours.     HYDROcodone-acetaminophen 10-325 MG per tablet  Commonly known as:  NORCO  Take 1 tablet by mouth every 8 (eight) hours as needed for pain.     pantoprazole 40 MG tablet  Commonly known as:  PROTONIX  Take 1 tablet (40 mg total) by mouth 2 (two) times daily.         CONSULTS: 1.  GI   PROCEDURES PERFORMED:  Diagnostic EGD 06/27/2012 FINDINGS: 1. Probable bleeding from mild proximal portal gastropathy. 2. Otherwise within normal limits EGD. RECOMMENDATIONS:  Advance diet questionably home later today and warn about no aspirin or nonsteroidals and to stop alcohol and continue pump inhibitors and consider beta blockers in the future particularly if he rebleeds from this cause and I would do an outpatient ultrasound to reevaluate his liver disease and I'm happy to see back when necessary.    ADMISSION DATA: H&P: [name] is a 57 yo male with a history of COPD, Hepatitis C and non-bleeding ulcer who presents to the ED after vomiting  blood 3 times this morning. Describes the blood as bright red. States that he has had 4 episodes total with one being in the ED. The amount of blood as decreased with each episode of vomiting. He states that he did not try anything to improve his symptoms and nothing has made it better. Nothing has made his symptoms worse.  He does not recall anything like this has happening in the past. During this time period he has also had chills and sweating. He denies any fever, abdominal or chest pain. States he has had bright red blood per rectum but no blood in the stools.    Physical Exam: Vitals: Blood pressure 135/88, pulse 75, temperature 98.8 F (37.1 C), temperature source Oral, resp. rate 20, SpO2 100.00%.  GENERAL: well developed, well nourished; no acute distress  HEAD: atraumatic, normocephalic  EYES: pupils equal,  round and reactive; sclera anicteric  NOSE:mild and clear drainage  MOUTH/THROAT: oropharynx clear, moist mucous membranes  NECK: supple, no carotid bruits, thyroid normal in size and without palpable nodules  LYMPH: left submandibular lymphadenopathy on a single node, no cervical or supraclavicular lymphadenopathy  LUNGS: no crackles, rhonchi and wheezing present throughout, normal work of breathing  HEART: normal rate and regular rhythm; normal S1 and S2 without S3 or S4; no murmurs, rubs, or clicks  PULSES: radial 2+ and symmetric  ABDOMEN: soft, non-tender, normal bowel sounds:  CRANIAL NERVES: pupils reactive to light bilaterally; extra occular muscles are intact; facial sensation is intact and equal bilaterally in V1, V2, and V3, hearing is equal bilaterally, uvula is midline and palate elevates symmetrically; tongue protrudes midline  SKIN: warm, dry, intact, normal turgor, no rashes  EXTREMITIES: no peripheral edema, clubbing, or cyanosis   Labs: Basic Metabolic Panel:   06/26/12 1212   NA  138   K  3.9   CL  101   CO2  26   GLUCOSE  90   BUN  11   CREATININE  0.89   CALCIUM  10.4     Liver Function Tests:   06/26/12 1212   AST  90*   ALT  65*   ALKPHOS  82   BILITOT  0.6   PROT  9.6*   ALBUMIN  4.5     CBC:   06/26/12 1212   WBC  5.3   NEUTROABS  3.0   HGB  14.7   HCT  41.6   MCV  91.8   PLT  139*     Cardiac Enzymes:    06/26/12 1231   TROPONINI  <0.30     Coagulation:   06/26/12 1212   LABPROT  12.9   INR  0.98       HOSPITAL COURSE: 1.   Portal gastropathy and hematemesis:  Mr. Malone is a 57 yo male who presented to the ED after having 3 episodes of vomiting blood.  His HgB on admission was 14.7 and dropped to 12.1 on the second day of admission.  FOBT was negative here.  An EGD was performed which revealed probable bleeding from mild proximal portal gastropathy but was otherwise within normal limits. Continue PPI but increase to BID;  consider beta blocker therapy in the future. GI recommended followup liver ultrasound as an outpatient.  2. Chronic hepatitis C virus:  At admission his AST = 90, ALT = 65. PT normal at 12.9. Last known viral load was 11/2010 and quantified as 762K. Since then patient has not received treatment because of chronic  alcohol and marijuana use. He was given first Hep A vaccine and is due to receive second dose in August 2014. Hepatitis B immune status unknown.  3. Asthma/COPD:  Stable during this hospitalization.  His O2 saturation was 100%.during hospitalization.  Plan is to continue home regimen of Mometasone-formoterol 2 puffs 2 times daily and Albuterol 2 puffs PRN.     DISCHARGE DATA: Vital Signs: BP 103/60  Pulse 58  Temp(Src) 98.9 F (37.2 C) (Oral)  Resp 15  Ht 6\' 1"  (1.854 m)  Wt 124 lb 12.5 oz (56.6 kg)  BMI 16.47 kg/m2  SpO2 99%  Labs: Results for orders placed during the hospital encounter of 06/26/12 (from the past 24 hour(s))  TYPE AND SCREEN     Status: None   Collection Time    06/26/12 12:30 PM      Result Value Range   ABO/RH(D) O POS     Antibody Screen NEG     Sample Expiration 06/29/2012    ABO/RH     Status: None   Collection Time    06/26/12 12:30 PM      Result Value Range   ABO/RH(D) O POS    TROPONIN I     Status: None   Collection Time    06/26/12 12:31 PM      Result Value Range   Troponin I <0.30  <0.30 ng/mL  LIPASE, BLOOD     Status: None   Collection Time    06/26/12 12:31 PM      Result Value Range   Lipase 33  11 - 59 U/L  OCCULT BLOOD GASTRIC / DUODENUM (SPECIMEN CUP)     Status: Abnormal   Collection Time    06/26/12  1:35 PM      Result Value Range   pH, Gastric NOT DONE     Occult Blood, Gastric POSITIVE (*) NEGATIVE  OCCULT BLOOD, POC DEVICE     Status: None   Collection Time    06/26/12  1:38 PM      Result Value Range   Fecal Occult Bld NEGATIVE  NEGATIVE  CBC     Status: Abnormal   Collection Time    06/26/12  8:45 PM       Result Value Range   WBC 4.1  4.0 - 10.5 K/uL   RBC 3.86 (*) 4.22 - 5.81 MIL/uL   Hemoglobin 12.2 (*) 13.0 - 17.0 g/dL   HCT 16.1 (*) 09.6 - 04.5 %   MCV 90.7  78.0 - 100.0 fL   MCH 31.6  26.0 - 34.0 pg   MCHC 34.9  30.0 - 36.0 g/dL   RDW 40.9  81.1 - 91.4 %   Platelets 110 (*) 150 - 400 K/uL  COMPREHENSIVE METABOLIC PANEL     Status: Abnormal   Collection Time    06/27/12  4:50 AM      Result Value Range   Sodium 135  135 - 145 mEq/L   Potassium 3.4 (*) 3.5 - 5.1 mEq/L   Chloride 102  96 - 112 mEq/L   CO2 25  19 - 32 mEq/L   Glucose, Bld 138 (*) 70 - 99 mg/dL   BUN 10  6 - 23 mg/dL   Creatinine, Ser 7.82  0.50 - 1.35 mg/dL   Calcium 9.2  8.4 - 95.6 mg/dL   Total Protein 7.3  6.0 - 8.3 g/dL   Albumin 3.3 (*) 3.5 - 5.2 g/dL   AST 59 (*) 0 - 37  U/L   ALT 46  0 - 53 U/L   Alkaline Phosphatase 59  39 - 117 U/L   Total Bilirubin 0.6  0.3 - 1.2 mg/dL   GFR calc non Af Amer >90  >90 mL/min   GFR calc Af Amer >90  >90 mL/min  CBC     Status: Abnormal   Collection Time    06/27/12  4:50 AM      Result Value Range   WBC 3.7 (*) 4.0 - 10.5 K/uL   RBC 3.86 (*) 4.22 - 5.81 MIL/uL   Hemoglobin 12.1 (*) 13.0 - 17.0 g/dL   HCT 40.9 (*) 81.1 - 91.4 %   MCV 90.4  78.0 - 100.0 fL   MCH 31.3  26.0 - 34.0 pg   MCHC 34.7  30.0 - 36.0 g/dL   RDW 78.2  95.6 - 21.3 %   Platelets 106 (*) 150 - 400 K/uL     Time spent on discharge: 34 minutes   Services Ordered on Discharge: 1. PT - no 2. OT - no 3. RN - no 4. Other - no   Signed by:  Dorthula Rue. Earlene Plater, MD PGY-I, Internal Medicine  06/27/2012, 3:08 PM

## 2012-06-27 NOTE — Consult Note (Signed)
Reason for Consult: Upper GI bleed Referring Physician: Hospital team  Dylan Duncan is an 57 y.o. male.  HPI: Patient seen at the request of the hospital team for upper GI bleeding x2 yesterday however subsequent vomitus did not show blood and he was guaiac-negative and he has not had any bowel movements and he does have a history of hepatitis C and ulcer years ago but no previous endoscopy and he is followed in hepatitis C clinic getting some type of vaccine every 6 months and his family history is negative and he has been on some over-the-counter medicines at home for his arm but currently is hungry and has no other complaint  Past Medical History  Diagnosis Date  . Asthma   . 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 Acid reflux     Past Surgical History  Procedure Laterality Date  . Mandible surgery    . Arm surgery Right     History reviewed. No pertinent family history.  Social History:  reports that he has been smoking.  He does not have any smokeless tobacco history on file. He reports that he drinks about 0.6 ounces of alcohol per week. He reports that he uses illicit drugs (Marijuana) about twice per week.  Allergies:  Allergies  Allergen Reactions  . Lactose Intolerance (Gi)   . Penicillins Itching    Medications: I have reviewed the patient's current medications.  Results for orders placed during the hospital encounter of 06/26/12 (from the past 48 hour(s))  CBC WITH DIFFERENTIAL     Status: Abnormal   Collection Time    06/26/12 12:12 PM      Result Value Range   WBC 5.3  4.0 - 10.5 K/uL   RBC 4.53  4.22 - 5.81 MIL/uL   Hemoglobin 14.7  13.0 - 17.0 g/dL   HCT 98.1  19.1 - 47.8 %   MCV 91.8  78.0 - 100.0 fL   MCH 32.5  26.0 - 34.0 pg   MCHC 35.3  30.0 - 36.0 g/dL   RDW 29.5  62.1 - 30.8 %   Platelets 139 (*) 150 - 400 K/uL   Neutrophils Relative 55  43 - 77 %   Neutro Abs 3.0  1.7 - 7.7 K/uL   Lymphocytes  Relative 32  12 - 46 %   Lymphs Abs 1.7  0.7 - 4.0 K/uL   Monocytes Relative 10  3 - 12 %   Monocytes Absolute 0.5  0.1 - 1.0 K/uL   Eosinophils Relative 2  0 - 5 %   Eosinophils Absolute 0.1  0.0 - 0.7 K/uL   Basophils Relative 0  0 - 1 %   Basophils Absolute 0.0  0.0 - 0.1 K/uL  COMPREHENSIVE METABOLIC PANEL     Status: Abnormal   Collection Time    06/26/12 12:12 PM      Result Value Range   Sodium 138  135 - 145 mEq/L   Potassium 3.9  3.5 - 5.1 mEq/L   Chloride 101  96 - 112 mEq/L   CO2 26  19 - 32 mEq/L   Glucose, Bld 90  70 - 99 mg/dL   BUN 11  6 - 23 mg/dL   Creatinine, Ser 6.57  0.50 - 1.35 mg/dL   Calcium 84.6  8.4 - 96.2 mg/dL   Total Protein 9.6 (*) 6.0 - 8.3 g/dL   Albumin 4.5  3.5 - 5.2 g/dL   AST 90 (*) 0 - 37 U/L   ALT 65 (*) 0 - 53 U/L   Alkaline Phosphatase 82  39 - 117 U/L   Total Bilirubin 0.6  0.3 - 1.2 mg/dL   GFR calc non Af Amer >90  >90 mL/min   GFR calc Af Amer >90  >90 mL/min   Comment:            The eGFR has been calculated     using the CKD EPI equation.     This calculation has not been     validated in all clinical     situations.     eGFR's persistently     <90 mL/min signify     possible Chronic Kidney Disease.  PROTIME-INR     Status: None   Collection Time    06/26/12 12:12 PM      Result Value Range   Prothrombin Time 12.9  11.6 - 15.2 seconds   INR 0.98  0.00 - 1.49  TYPE AND SCREEN     Status: None   Collection Time    06/26/12 12:30 PM      Result Value Range   ABO/RH(D) O POS     Antibody Screen NEG     Sample Expiration 06/29/2012    ABO/RH     Status: None   Collection Time    06/26/12 12:30 PM      Result Value Range   ABO/RH(D) O POS    TROPONIN I     Status: None   Collection Time    06/26/12 12:31 PM      Result Value Range   Troponin I <0.30  <0.30 ng/mL   Comment:            Due to the release kinetics of cTnI,     a negative result within the first hours     of the onset of symptoms does not rule out      myocardial infarction with certainty.     If myocardial infarction is still suspected,     repeat the test at appropriate intervals.  LIPASE, BLOOD     Status: None   Collection Time    06/26/12 12:31 PM      Result Value Range   Lipase 33  11 - 59 U/L  OCCULT BLOOD GASTRIC / DUODENUM (SPECIMEN CUP)     Status: Abnormal   Collection Time    06/26/12  1:35 PM      Result Value Range   pH, Gastric NOT DONE     Occult Blood, Gastric POSITIVE (*) NEGATIVE  OCCULT BLOOD, POC DEVICE     Status: None   Collection Time    06/26/12  1:38 PM      Result Value Range   Fecal Occult Bld NEGATIVE  NEGATIVE  CBC     Status: Abnormal   Collection Time    06/26/12  8:45 PM      Result Value Range   WBC 4.1  4.0 - 10.5 K/uL   RBC 3.86 (*) 4.22 - 5.81 MIL/uL   Hemoglobin 12.2 (*) 13.0 - 17.0 g/dL   Comment: DELTA CHECK NOTED     REPEATED TO VERIFY   HCT 35.0 (*) 39.0 - 52.0 %   MCV 90.7  78.0 - 100.0 fL   MCH 31.6  26.0 - 34.0 pg   MCHC 34.9  30.0 - 36.0 g/dL   RDW 12.9  11.5 - 15.5 %   Platelets 110 (*) 150 - 400 K/uL   Comment: PLATELET COUNT CONFIRMED BY SMEAR     LARGE PLATELETS PRESENT  COMPREHENSIVE METABOLIC PANEL     Status: Abnormal   Collection Time    06/27/12  4:50 AM      Result Value Range   Sodium 135  135 - 145 mEq/L   Potassium 3.4 (*) 3.5 - 5.1 mEq/L   Chloride 102  96 - 112 mEq/L   CO2 25  19 - 32 mEq/L   Glucose, Bld 138 (*) 70 - 99 mg/dL   BUN 10  6 - 23 mg/dL   Creatinine, Ser 4.09  0.50 - 1.35 mg/dL   Calcium 9.2  8.4 - 81.1 mg/dL   Total Protein 7.3  6.0 - 8.3 g/dL   Albumin 3.3 (*) 3.5 - 5.2 g/dL   AST 59 (*) 0 - 37 U/L   ALT 46  0 - 53 U/L   Alkaline Phosphatase 59  39 - 117 U/L   Total Bilirubin 0.6  0.3 - 1.2 mg/dL   GFR calc non Af Amer >90  >90 mL/min   GFR calc Af Amer >90  >90 mL/min   Comment:            The eGFR has been calculated     using the CKD EPI equation.     This calculation has not been     validated in all clinical     situations.      eGFR's persistently     <90 mL/min signify     possible Chronic Kidney Disease.  CBC     Status: Abnormal   Collection Time    06/27/12  4:50 AM      Result Value Range   WBC 3.7 (*) 4.0 - 10.5 K/uL   RBC 3.86 (*) 4.22 - 5.81 MIL/uL   Hemoglobin 12.1 (*) 13.0 - 17.0 g/dL   HCT 91.4 (*) 78.2 - 95.6 %   MCV 90.4  78.0 - 100.0 fL   MCH 31.3  26.0 - 34.0 pg   MCHC 34.7  30.0 - 36.0 g/dL   RDW 21.3  08.6 - 57.8 %   Platelets 106 (*) 150 - 400 K/uL   Comment: CONSISTENT WITH PREVIOUS RESULT    No results found.  ROS negative except above Blood pressure 147/87, pulse 58, temperature 98.9 F (37.2 C), temperature source Oral, resp. rate 17, height 6\' 1"  (1.854 m), weight 56.6 kg (124 lb 12.5 oz), SpO2 100.00%. Physical Exam vital signs stable afebrile no acute distress lungs clear regular rate and rhythm abdomen is soft nontender labs reviewed  Assessment/Plan: Seemingly self-limited hematemesis Plan: The risks benefits methods of endoscopy was discussed and will proceed this morning with further workup and plans pending those findings Ormand Senn E 06/27/2012, 11:35 AM

## 2012-06-27 NOTE — Progress Notes (Signed)
Patient was discharged home. Patient was discharged with a bus pass because he was on driving restrictions for the day. Patient drove himself to the hospital. Patient was told the importance of not driving today and to take the bus. Patient had an EGD and had sedation. Patient was agitated and very much wanted to leave today. Patient was given discharge instructions and information on new prescriptions. Patient refused taking a wheelchair out. Patient was stable on discharge.

## 2012-06-28 ENCOUNTER — Encounter (HOSPITAL_COMMUNITY): Payer: Self-pay | Admitting: Gastroenterology

## 2012-07-21 ENCOUNTER — Encounter (HOSPITAL_COMMUNITY): Payer: Self-pay | Admitting: Emergency Medicine

## 2012-07-21 ENCOUNTER — Emergency Department (HOSPITAL_COMMUNITY)
Admission: EM | Admit: 2012-07-21 | Discharge: 2012-07-21 | Discharge status: Against Medical Advice | Payer: Medicaid Other | Attending: Emergency Medicine | Admitting: Emergency Medicine

## 2012-07-21 ENCOUNTER — Emergency Department (HOSPITAL_COMMUNITY): Payer: Medicaid Other

## 2012-07-21 DIAGNOSIS — Z880 Allergy status to penicillin: Secondary | ICD-10-CM | POA: Insufficient documentation

## 2012-07-21 DIAGNOSIS — R61 Generalized hyperhidrosis: Secondary | ICD-10-CM | POA: Insufficient documentation

## 2012-07-21 DIAGNOSIS — Z79899 Other long term (current) drug therapy: Secondary | ICD-10-CM | POA: Insufficient documentation

## 2012-07-21 DIAGNOSIS — K219 Gastro-esophageal reflux disease without esophagitis: Secondary | ICD-10-CM | POA: Insufficient documentation

## 2012-07-21 DIAGNOSIS — J45909 Unspecified asthma, uncomplicated: Secondary | ICD-10-CM | POA: Insufficient documentation

## 2012-07-21 DIAGNOSIS — K8020 Calculus of gallbladder without cholecystitis without obstruction: Secondary | ICD-10-CM

## 2012-07-21 DIAGNOSIS — F17200 Nicotine dependence, unspecified, uncomplicated: Secondary | ICD-10-CM | POA: Insufficient documentation

## 2012-07-21 DIAGNOSIS — K920 Hematemesis: Secondary | ICD-10-CM

## 2012-07-21 DIAGNOSIS — R109 Unspecified abdominal pain: Secondary | ICD-10-CM | POA: Insufficient documentation

## 2012-07-21 DIAGNOSIS — Z8619 Personal history of other infectious and parasitic diseases: Secondary | ICD-10-CM | POA: Insufficient documentation

## 2012-07-21 MED ORDER — KETOROLAC TROMETHAMINE 60 MG/2ML IM SOLN: 60.0000 mg | Freq: Once | INTRAMUSCULAR | Status: DC

## 2012-07-21 MED ORDER — ONDANSETRON 4 MG PO TBDP
4.0000 mg | ORAL_TABLET | Freq: Once | ORAL | Status: AC
  Administered 2012-07-21: 4 mg via ORAL
  Filled 2012-07-21: qty 1

## 2012-07-21 NOTE — ED Notes (Signed)
Pt states he does not want tordol injection

## 2012-07-21 NOTE — ED Provider Notes (Signed)
History     CSN: [id number]  Arrival date & time 07/21/12  1727   First MD Initiated Contact with Patient 07/21/12 1729      No chief complaint on file.   (Consider location/radiation/quality/duration/timing/severity/associated sxs/prior treatment) HPI Comments: 57 y/o male with a PMHx of acid reflux and hepatitis c presents to the ED complaining of 3-4 episodes of "dark vomit" that looked like it may have been bloody today. He brought the vomit into the ED with him. Last night he ate pork chops for dinner and felt fine, this morning began feeling nauseated and vomited. He was able to keep chicken noodle soup down for lunch and has not vomited since. Admits to associated abdominal pain described as crampy rated 7/10. He has not tried any alleviating factors. Admits to diaphoresis with vomiting. No alcohol intake over the past couple months. He has been advised in the past to not take NSAIDs, however a few days back took an OTC medication and believes it had ibuprofen in it. He was admitted to the hospital on 4/12 for similar symptoms and had an EGD which showed probable bleeding from mild proximal portal gastropathy, otherwise within normal limits EGD. Advised to avoid NSAIDs and alcohol. Patient states he has not had any problems until today since leaving the hospital last month. Denies weakness, fatigue, hematochezia or melena.   The history is provided by the patient.    Past Medical History  Diagnosis Date  . Asthma   . 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 Acid reflux     Past Surgical History  Procedure Laterality Date  . Mandible surgery    . Arm surgery Right   . Esophagogastroduodenoscopy N/A 06/27/2012    Procedure: ESOPHAGOGASTRODUODENOSCOPY (EGD);  Surgeon: [name], MD;  Location: [facility] ENDOSCOPY;  Service: Endoscopy;  Laterality: N/A;    No family history on file.  History  Substance Use Topics  . Smoking status:  Current Every Day Smoker -- 0.14 packs/day for 4 years  . Smokeless tobacco: Not on file  . Alcohol Use: .6 - 1.2 oz/week    1-2 Cans of beer per week     Comment: former drinker      Review of Systems  Constitutional: Positive for diaphoresis. Negative for fever and chills.  HENT: Negative for neck pain and neck stiffness.   Respiratory: Negative for shortness of breath.   Cardiovascular: Negative for chest pain.  Gastrointestinal: Positive for nausea, vomiting and abdominal pain. Negative for diarrhea, constipation and blood in stool.       Positive for hematemesis.  Genitourinary: Negative for dysuria, frequency and difficulty urinating.       Positive for "dark" urine.  Musculoskeletal: Negative for back pain.  Neurological: Negative for dizziness, weakness and light-headedness.  Psychiatric/Behavioral: Negative for confusion.  All other systems reviewed and are negative.    Allergies  Lactose intolerance (gi) and Penicillins  Home Medications   Current Outpatient Rx  Name  Route  Sig  Dispense  Refill  . albuterol (PROVENTIL HFA;VENTOLIN HFA) 108 (90 BASE) MCG/ACT inhaler   Inhalation   Inhale 2 puffs into the lungs every 6 (six) hours as needed for wheezing.         . Fluticasone-Salmeterol (ADVAIR) 250-50 MCG/DOSE AEPB   Inhalation   Inhale 1 puff into the lungs every 12 (twelve) hours.         Marland Kitchen HYDROcodone-acetaminophen (  NORCO) 10-325 MG per tablet   Oral   Take 1 tablet by mouth every 8 (eight) hours as needed for pain.         . pantoprazole (PROTONIX) 40 MG tablet   Oral   Take 1 tablet (40 mg total) by mouth 2 (two) times daily.   60 tablet   0     BP 133/74  Pulse 90  Temp(Src) 99 F (37.2 C) (Oral)  Resp 16  SpO2 100%  Physical Exam  Nursing note and vitals reviewed. Constitutional: He is oriented to person, place, and time. He appears well-developed and well-nourished. No distress.  HENT:  Head: Normocephalic and atraumatic.   Mouth/Throat: Oropharynx is clear and moist and mucous membranes are normal.  Eyes: Conjunctivae and EOM are normal. No scleral icterus.  Neck: Normal range of motion. Neck supple. No tracheal deviation present.  Cardiovascular: Normal rate, regular rhythm and normal heart sounds.   Pulmonary/Chest: Effort normal and breath sounds normal. No respiratory distress.  Abdominal: Soft. Normal appearance and bowel sounds are normal. He exhibits no distension and no mass. There is tenderness in the right upper quadrant and epigastric area. There is guarding. There is no rigidity and no rebound.  No peritoneal signs.  Musculoskeletal: Normal range of motion. He exhibits no edema.  Neurological: He is alert and oriented to person, place, and time.  Skin: Skin is warm and dry. He is not diaphoretic.  Psychiatric: He has a normal mood and affect. His behavior is normal.    ED Course  Procedures (including critical care time)  Labs Reviewed - No data to display US Abdomen Complete  07/21/2012  *RADIOLOGY REPORT*  Clinical Data:  Epigastric pain  COMPLETE ABDOMINAL ULTRASOUND  Comparison:  None.  Findings:  Gallbladder:  Multiple small gallstones.  Negative sonographic Murphy's sign.  Gallbladder wall is not thickened.  Common bile duct:  1.4 mm  Liver:  Increased echogenicity of the liver consistent with fatty infiltration.  No focal liver lesion.  IVC:  Appears normal.  Pancreas:  The pancreas is hypoechoic which could be due to pancreatitis.  However the pancreas is not significantly enlarged and  there is no fluid collection.  Spleen:  6.4 cm  Right Kidney:  11.6 cm.  Negative  Left Kidney:  10.7 cm.  Negative  Abdominal aorta:  No aneurysm identified.  IMPRESSION: Multiple gallstones.  Negative for acute cholecystitis.  Fatty infiltration of the liver.  Hypoechoic pancreas could represent pancreatic edema however the pancreas is not enlarged.   Original Report Authenticated By: [name], M.D.       1. Gallstones   2. Hematemesis       MDM  57 y/o male with mid-epigastric and RUQ abdominal tenderness with guarding, possible hematemesis. Vomitus brought into the ED dark brown, no obvious blood, no coffee ground appearance. Abdominal ultrasound obtained showing gallstones without cholecystitis, possible pancreatic edema. Labs including cbc, cmp, lipase and occult gastric blood ordered, however patient left AMA before these could be drawn. Close return precautions discussed and GI f/u advised.  Trevor Mace, PA-C 07/21/12 1925

## 2012-07-21 NOTE — ED Notes (Signed)
Pt states he is vomiting up blood with lower abd pain from vomiting.

## 2012-07-21 NOTE — ED Notes (Signed)
Pt states he wants to leave. States he will come back tomorrow for his test results. Melina Schools, PA notified

## 2012-07-27 NOTE — ED Provider Notes (Signed)
Medical screening examination/treatment/procedure(s) were performed by non-physician practitioner and as supervising physician I was immediately available for consultation/collaboration.   Suzi Roots, MD 07/27/12 1054

## 2012-08-04 ENCOUNTER — Encounter (INDEPENDENT_AMBULATORY_CARE_PROVIDER_SITE_OTHER): Payer: Self-pay | Admitting: Surgery

## 2012-08-04 ENCOUNTER — Ambulatory Visit (INDEPENDENT_AMBULATORY_CARE_PROVIDER_SITE_OTHER): Payer: Medicaid Other | Admitting: Surgery

## 2012-08-04 VITALS — BP 110/80 | HR 69 | Temp 97.8°F | Resp 18 | Ht 73.0 in | Wt 127.6 lb

## 2012-08-04 DIAGNOSIS — F1011 Alcohol abuse, in remission: Secondary | ICD-10-CM | POA: Insufficient documentation

## 2012-08-04 DIAGNOSIS — K7689 Other specified diseases of liver: Secondary | ICD-10-CM

## 2012-08-04 DIAGNOSIS — K8020 Calculus of gallbladder without cholecystitis without obstruction: Secondary | ICD-10-CM

## 2012-08-04 DIAGNOSIS — K7581 Nonalcoholic steatohepatitis (NASH): Secondary | ICD-10-CM

## 2012-08-04 NOTE — Patient Instructions (Addendum)
You have had bleeding from your stomach block is likely due to portal hypertension from her liver disease and alcohol use and use of nonsteroidals such as aspirin and ibuprofen.  Avoid alcohol and nonsteroidals (aspirin, ibuprofen, naproxen)  You have gallstones noted on ultrasound.  I do not think that is the cause of your nausea / vomiting.  Hematemesis This condition is the vomiting of blood. CAUSES  This can happen if you have a peptic ulcer or an irritation of the throat, stomach, or small bowel. Vomiting over and over again or swallowing blood from a nosebleed, coughing or facial injury can also result in bloody vomit. Anti-inflammatory pain medicines are a common cause of this potentially dangerous condition. The most serious causes of vomiting blood include:  Ulcers (a bacteria called H. pylori is common cause of ulcers).  Clotting problems.  Alcoholism.  Cirrhosis. TREATMENT  Treatment depends on the cause and the severity of the bleeding. Small amounts of blood streaks in the vomit is not the same as vomiting large amounts of bloody or dark, coffee grounds-like material. Weakness, fainting, dehydration, anemia, and continued alcohol or drug use increase the risk. Examination may include blood, vomit, or stool tests. The presence of bloody or dark stool that tests positive for blood (Hemoccult) means the bleeding has been going on for some time. Endoscopy and imaging studies may be done. Emergency treatment may include:  IV medicines or fluids.  Blood transfusions.  Surgery. Hospital care is required for high risk patients or when IV fluids or blood is needed. Upper GI bleeding can cause shock and death if not controlled. HOME CARE INSTRUCTIONS   Your treatment does not require hospital care at this time.  Remain at rest until your condition improves.  Drink clear liquids as tolerated.  Avoid:  Alcohol.  Nicotine.  Aspirin.  Any other anti-inflammatory medicine  (ibuprofen, naproxen, and many others).  Medications to suppress stomach acid or vomiting may be needed. Take all your medicine as prescribed.  Be sure to see your caregiver for follow-up as recommended. SEEK IMMEDIATE MEDICAL CARE IF:   You have repeated vomiting, dehydration, fainting, or extreme weakness.  You are vomiting large amounts of bloody or dark material.  You pass large, dark or bloody stools. Document Released: 04/10/2004 Document Revised: 05/26/2011 Document Reviewed: 04/26/2008 [facility] Patient Information 2014 [place], [place].  Hepatitis C Hepatitis C is a liver infection. It is caused by a germ called hepatitis C virus (HCV). This germ enters the body through blood and other bodily fluids. HOME CARE  Do not drink alcohol.  Only take medicines as told by your doctor.  Keep all blood test visits as told by your doctor.  Do not have sex until your doctor says it is okay.  Avoid activities that could put other people in contact with your blood. Do not share a toothbrush, nail clippers, razors, or needles.  Follow your doctor's advice to avoid spreading the infection to others. GET HELP RIGHT AWAY IF:  You feel very tired or weak.  You have a temperature by mouth above 102 F (38.9 C), not controlled by medicine.  You do not feel like eating (loss of appetite).  You feel sick to your stomach (nauseous) or throw up (vomit).  Your skin or the whites of your eyes turn yellow (jaundice).  You bruise or bleed easily.  You have any severe problems after treatment. MAKE SURE YOU:  Understand these instructions.  Will watch your condition.  Will get help  right away if you are not doing well or get worse. Document Released: 02/14/2008 Document Revised: 05/26/2011 Document Reviewed: 07/04/2010 [facility] Patient Information 2014 [place], [place].   Alcohol Problems Most adults who drink alcohol drink in moderation (not a lot) are at low risk for developing  problems related to their drinking. However, all drinkers, including low-risk drinkers, should know about the health risks connected with drinking alcohol. RECOMMENDATIONS FOR LOW-RISK DRINKING  Drink in moderation. Moderate drinking is defined as follows:   Men - no more than 2 drinks per day.  Nonpregnant women - no more than 1 drink per day.  Over age 3 - no more than 1 drink per day. A standard drink is 12 grams of pure alcohol, which is equal to a 12 ounce bottle of beer or wine cooler, a 5 ounce glass of wine, or 1.5 ounces of distilled spirits (such as whiskey, brandy, vodka, or rum).  ABSTAIN FROM (DO NOT DRINK) ALCOHOL:  When pregnant or considering pregnancy.  When taking a medication that interacts with alcohol.  If you are alcohol dependent.  A medical condition that prohibits drinking alcohol (such as ulcer, liver disease, or heart disease). DISCUSS WITH YOUR CAREGIVER:  If you are at risk for coronary heart disease, discuss the potential benefits and risks of alcohol use: Light to moderate drinking is associated with lower rates of coronary heart disease in certain populations (for example, men over age 26 and postmenopausal women). Infrequent or nondrinkers are advised not to begin light to moderate drinking to reduce the risk of coronary heart disease so as to avoid creating an alcohol-related problem. Similar protective effects can likely be gained through proper diet and exercise.  Women and the elderly have smaller amounts of body water than men. As a result women and the elderly achieve a higher blood alcohol concentration after drinking the same amount of alcohol.  Exposing a fetus to alcohol can cause a broad range of birth defects referred to as Fetal Alcohol Syndrome (FAS) or Alcohol-Related Birth Defects (ARBD). Although FAS/ARBD is connected with excessive alcohol consumption during pregnancy, studies also have reported neurobehavioral problems in infants born to  mothers reporting drinking an average of 1 drink per day during pregnancy.  Heavier drinking (the consumption of more than 4 drinks per occasion by men and more than 3 drinks per occasion by women) impairs learning (cognitive) and psychomotor functions and increases the risk of alcohol-related problems, including accidents and injuries. CAGE QUESTIONS:   Have you ever felt that you should Cut down on your drinking?  Have people Annoyed you by criticizing your drinking?  Have you ever felt bad or Guilty about your drinking?  Have you ever had a drink first thing in the morning to steady your nerves or get rid of a hangover (Eye opener)? If you answered positively to any of these questions: You may be at risk for alcohol-related problems if alcohol consumption is:   Men: Greater than 14 drinks per week or more than 4 drinks per occasion.  Women: Greater than 7 drinks per week or more than 3 drinks per occasion. Do you or your family have a medical history of alcohol-related problems, such as:  Blackouts.  Sexual dysfunction.  Depression.  Trauma.  Liver dysfunction.  Sleep disorders.  Hypertension.  Chronic abdominal pain.  Has your drinking ever caused you problems, such as problems with your family, problems with your work (or school) performance, or accidents/injuries?  Do you have a compulsion to drink  or a preoccupation with drinking?  Do you have poor control or are you unable to stop drinking once you have started?  Do you have to drink to avoid withdrawal symptoms?  Do you have problems with withdrawal such as tremors, nausea, sweats, or mood disturbances?  Does it take more alcohol than in the past to get you high?  Do you feel a strong urge to drink?  Do you change your plans so that you can have a drink?  Do you ever drink in the morning to relieve the shakes or a hangover? If you have answered a number of the previous questions positively, it may be time  for you to talk to your caregivers, family, and friends and see if they think you have a problem. Alcoholism is a chemical dependency that keeps getting worse and will eventually destroy your health and relationships. Many alcoholics end up dead, impoverished, or in prison. This is often the end result of all chemical dependency.  Do not be discouraged if you are not ready to take action immediately.  Decisions to change behavior often involve up and down desires to change and feeling like you cannot decide.  Try to think more seriously about your drinking behavior.  Think of the reasons to quit. WHERE TO GO FOR ADDITIONAL INFORMATION   The National Institute on Alcohol Abuse and Alcoholism (NIAAA) BasicStudents.dk  [employer] on Alcoholism and Drug Dependence (NCADD) www.ncadd.org  American Society of Addiction Medicine (ASAM) RoyalDiary.gl  Document Released: 03/03/2005 Document Revised: 05/26/2011 Document Reviewed: 10/20/2007 [facility] Patient Information 2014 [place], [place].

## 2012-08-04 NOTE — Progress Notes (Signed)
Subjective:     Patient ID: Dylan Duncan, male   DOB: 1955-12-10, 57 y.o.   MRN: [id number]  HPI  DEVUN Duncan  Nov 04, 1955 [id number]  Patient Care Team: Dylan Duncan as PCP - General (Specialist) Dylan Kuba, MD as Consulting Physician (Gastroenterology) Dylan Duncan as Consulting Physician (Gastroenterology)  This patient is a 57 y.o.male who presents today for surgical evaluation at the request of Dr. Mayford Duncan.   Reason for visit: Nausea vomiting.  Gallstones  Middle-age man with history of hepatitis C and alcohol abuse/dependence in the past.  Claims to be more or less abstinent.  Is seeing a hepatologist at [facility] and most recently [place] in fall 2013.  Began to notice having episodes of vomiting.  Dark bile.  Thought it was blood.  Worsened.  Was admitted.  Endoscopy By Dr. [name] showed probable portal gastropathy but no active bleeding.  Recommended to be placed on antacid medicines and stopped using nonsteroidals.  Patient does not recall most of this - "They couldn't figure out what was going on".  Had another episode of vomiting.  Went to the emergency room. Apparently pain was more right-sided there.  Patient claims the pain was in the lower abdomen.  Not worse on the left vs. The right.  Did not radiate to the back or shoulder.  No bloating.  Not associated with eating.  Part of the workup included an ultrasound which showed gallstones.  Seemed more suspicious for hematemesis again.  Follow up with primary care physician.  Possibility of gallbladder etiology entertained.  Therefore, patient sent to me for surgical consultation.  It is very difficult to get history out of the patient.  His answers tend to be vague and evasive.  Best I can gather he can he do most anything he wants.  Sometimes not as well.  He cannot think of any specific food triggers.  Denies much in the way of alcohol or smoking now although records say otherwise.  His never had a bowel surgery.  He is a  bowel movement usually twice a day.  Some shortness of breath.  Claims he has COPD and asthma but does not seem to need his inhaler much.  Not on oxygen.  Exercise tolerance not declined.  Patient Active Problem List   Diagnosis Date Noted  . Asymptomatic gallstones 08/04/2012  . Steatohepatitis, HCV & EtOH 08/04/2012  . History of ETOH abuse 08/04/2012  . Portal hypertensive gastropathy 06/27/2012  . Hematemesis 06/26/2012  . Hepatitis C   . Asthma     Past Medical History  Diagnosis Date  . Asthma   . 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 Acid reflux   . COPD (chronic obstructive pulmonary disease)     ?Uses Adviar?  . Emphysema of lung   . Post traumatic stress disorder (PTSD)     Past Surgical History  Procedure Laterality Date  . Mandible surgery    . Arm surgery Right   . Esophagogastroduodenoscopy N/A 06/27/2012    Procedure: ESOPHAGOGASTRODUODENOSCOPY (EGD);  Surgeon: Dylan Kuba, MD;  Location: [facility] ENDOSCOPY;  Service: Endoscopy;  Laterality: N/A;    History   Social History  . Marital Status: Single    Spouse Name: N/A    Number of Children: N/A  . Years of Education: N/A   Occupational History  . Not on file.   Social History Main Topics  .  Smoking status: Former Smoker -- 0.14 packs/day for 4 years    Types: Cigarettes    Quit date: 03/17/2010  . Smokeless tobacco: Never Used  . Alcohol Use: 1.2 oz/week    2 Cans of beer per week     Comment: Daily.  . Drug Use: 2.00 per week    Special: Marijuana  . Sexually Active: Yes    Birth Control/ Protection: Condom   Other Topics Concern  . Not on file   Social History Narrative  . No narrative on file    History reviewed. No pertinent family history.  Current Outpatient Prescriptions  Medication Sig Dispense Refill  . albuterol (PROVENTIL HFA;VENTOLIN HFA) 108 (90 BASE) MCG/ACT inhaler Inhale 2 puffs into the lungs every 6 (six) hours as needed  for wheezing.      . Fluticasone-Salmeterol (ADVAIR) 250-50 MCG/DOSE AEPB Inhale 1 puff into the lungs every 12 (twelve) hours.      Marland Kitchen HYDROcodone-acetaminophen (NORCO) 10-325 MG per tablet Take 1 tablet by mouth every 8 (eight) hours as needed for pain.      . pantoprazole (PROTONIX) 40 MG tablet Take 1 tablet (40 mg total) by mouth 2 (two) times daily.  60 tablet  0   No current facility-administered medications for this visit.     Allergies  Allergen Reactions  . Lactose Intolerance (Gi)   . Penicillins Itching    BP 110/80  Pulse 69  Temp(Src) 97.8 F (36.6 C) (Temporal)  Resp 18  Ht 6\' 1"  (1.854 m)  Wt 127 lb 9.6 oz (57.879 kg)  BMI 16.84 kg/m2  US Abdomen Complete  07/21/2012   *RADIOLOGY REPORT*  Clinical Data:  Epigastric pain  COMPLETE ABDOMINAL ULTRASOUND  Comparison:  None.  Findings:  Gallbladder:  Multiple small gallstones.  Negative sonographic Murphy's sign.  Gallbladder wall is not thickened.  Common bile duct:  1.4 mm  Liver:  Increased echogenicity of the liver consistent with fatty infiltration.  No focal liver lesion.  IVC:  Appears normal.  Pancreas:  The pancreas is hypoechoic which could be due to pancreatitis.  However the pancreas is not significantly enlarged and  there is no fluid collection.  Spleen:  6.4 cm  Right Kidney:  11.6 cm.  Negative  Left Kidney:  10.7 cm.  Negative  Abdominal aorta:  No aneurysm identified.  IMPRESSION: Multiple gallstones.  Negative for acute cholecystitis.  Fatty infiltration of the liver.  Hypoechoic pancreas could represent pancreatic edema however the pancreas is not enlarged.   Original Report Authenticated By: [name], M.D.     Review of Systems  Constitutional: Negative for fever, chills and diaphoresis.  HENT: Negative for nosebleeds, sore throat, facial swelling, mouth sores, trouble swallowing and ear discharge.   Eyes: Negative for photophobia, discharge and visual disturbance.  Respiratory: Negative for choking,  chest tightness, shortness of breath and stridor.   Cardiovascular: Negative for chest pain and palpitations.  Gastrointestinal: Positive for nausea, vomiting and abdominal pain. Negative for diarrhea, constipation, blood in stool, abdominal distention, anal bleeding and rectal pain.  Endocrine: Negative for cold intolerance and heat intolerance.  Genitourinary: Negative for dysuria, urgency, difficulty urinating and testicular pain.  Musculoskeletal: Negative for myalgias, back pain, arthralgias and gait problem.  Skin: Negative for color change, pallor, rash and wound.  Allergic/Immunologic: Negative for environmental allergies and food allergies.  Neurological: Negative for dizziness, speech difficulty, weakness, numbness and headaches.  Hematological: Negative for adenopathy. Does not bruise/bleed easily.  Psychiatric/Behavioral: Negative for hallucinations,  confusion and agitation.       Objective:   Physical Exam  Constitutional: He is oriented to person, place, and time. He appears well-developed and well-nourished. No distress.  HENT:  Head: Normocephalic.  Mouth/Throat: Oropharynx is clear and moist. No oropharyngeal exudate.  Eyes: Conjunctivae and EOM are normal. Pupils are equal, round, and reactive to light. No scleral icterus.  Neck: Normal range of motion. Neck supple. No tracheal deviation present.  Cardiovascular: Normal rate, regular rhythm and intact distal pulses.   Pulmonary/Chest: Effort normal and breath sounds normal. No respiratory distress.  Abdominal: Soft. Bowel sounds are normal. He exhibits no distension and no pulsatile liver. There is no tenderness. There is no rigidity, no rebound, no guarding, no CVA tenderness, no tenderness at McBurney's point and negative Murphy's sign. No hernia. Hernia confirmed negative in the ventral area, confirmed negative in the right inguinal area and confirmed negative in the left inguinal area.  Musculoskeletal: Normal range of  motion. He exhibits no tenderness.  Lymphadenopathy:    He has no cervical adenopathy.       Right: No inguinal adenopathy present.       Left: No inguinal adenopathy present.  Neurological: He is alert and oriented to person, place, and time. No cranial nerve deficit. He exhibits normal muscle tone. Coordination normal.  Skin: Skin is warm and dry. No rash noted. He is not diaphoretic. No erythema. No pallor.  Psychiatric: His speech is normal. Judgment and thought content normal. He is slowed. He is not withdrawn and not combative. Cognition and memory are impaired. He exhibits abnormal recent memory and abnormal remote memory.  Poor recollection, tends to fade questions and says I do not know a lot.  Take some time to process answering.       Assessment:     Episodes of nausea and vomiting and possible hematemesis in a patient with hepatitis C and portal gastropathy.  Gallstones.  Story not consistent with biliary colic.  I am skeptical they are the source of the problem.     Plan:     I printed out his prior visits and workup and endoscopy.  I went over the pathophysiology of cirrhosis and portal gastropathy and cholecystitis.   I strongly recommend he followup with a hepatologist.  He is due for a six-month followup to be reevaluated to see if he needs more aggressive treatment or therapy.  I stressed the importance of avoiding nonsteroidals such as aspirin ibuprofen and naproxen.  Noted he may need propranolol or other medications to help control his portal hypertension if he has persistent bleeding issues.  Do not know if preemptive banding is appropriate or not.  Defer to gastroenterology and hepatology.  I see no reason to do cholecystectomy without absence of symptoms, especially a patient with cirrhosis and other health issues.  He did not seem interested in surgery at this time yet seemed disappointed I did not have an easy solution for his issues.

## 2012-10-10 ENCOUNTER — Emergency Department (HOSPITAL_COMMUNITY)
Admission: EM | Admit: 2012-10-10 | Discharge: 2012-10-10 | Disposition: A | Discharge status: Home / Self Care | Payer: Medicaid Other | Attending: Emergency Medicine | Admitting: Emergency Medicine

## 2012-10-10 ENCOUNTER — Encounter (HOSPITAL_COMMUNITY): Payer: Self-pay | Admitting: *Deleted

## 2012-10-10 ENCOUNTER — Emergency Department (HOSPITAL_COMMUNITY): Payer: Medicaid Other

## 2012-10-10 DIAGNOSIS — Z79899 Other long term (current) drug therapy: Secondary | ICD-10-CM | POA: Insufficient documentation

## 2012-10-10 DIAGNOSIS — Z8619 Personal history of other infectious and parasitic diseases: Secondary | ICD-10-CM | POA: Insufficient documentation

## 2012-10-10 DIAGNOSIS — Z8709 Personal history of other diseases of the respiratory system: Secondary | ICD-10-CM | POA: Insufficient documentation

## 2012-10-10 DIAGNOSIS — Z880 Allergy status to penicillin: Secondary | ICD-10-CM | POA: Insufficient documentation

## 2012-10-10 DIAGNOSIS — R05 Cough: Secondary | ICD-10-CM | POA: Insufficient documentation

## 2012-10-10 DIAGNOSIS — J441 Chronic obstructive pulmonary disease with (acute) exacerbation: Secondary | ICD-10-CM | POA: Insufficient documentation

## 2012-10-10 DIAGNOSIS — R059 Cough, unspecified: Secondary | ICD-10-CM | POA: Insufficient documentation

## 2012-10-10 DIAGNOSIS — K219 Gastro-esophageal reflux disease without esophagitis: Secondary | ICD-10-CM | POA: Insufficient documentation

## 2012-10-10 DIAGNOSIS — Z87891 Personal history of nicotine dependence: Secondary | ICD-10-CM | POA: Insufficient documentation

## 2012-10-10 DIAGNOSIS — Z8659 Personal history of other mental and behavioral disorders: Secondary | ICD-10-CM | POA: Insufficient documentation

## 2012-10-10 DIAGNOSIS — J45901 Unspecified asthma with (acute) exacerbation: Secondary | ICD-10-CM | POA: Insufficient documentation

## 2012-10-10 MED ORDER — PREDNISONE 20 MG PO TABS
60.0000 mg | ORAL_TABLET | Freq: Once | ORAL | Status: AC
Start: 2012-10-10 — End: 2012-10-10
  Administered 2012-10-10: 60 mg via ORAL
  Filled 2012-10-10: qty 3

## 2012-10-10 MED ORDER — PREDNISONE 10 MG PO TABS: 20.0000 mg | ORAL_TABLET | Freq: Every day | ORAL | Status: DC

## 2012-10-10 MED ORDER — IPRATROPIUM BROMIDE 0.02 % IN SOLN
0.5000 mg | Freq: Once | RESPIRATORY_TRACT | Status: AC
  Administered 2012-10-10: 0.5 mg via RESPIRATORY_TRACT
  Filled 2012-10-10: qty 2.5

## 2012-10-10 MED ORDER — ALBUTEROL SULFATE (5 MG/ML) 0.5% IN NEBU
5.0000 mg | INHALATION_SOLUTION | Freq: Once | RESPIRATORY_TRACT | Status: AC
  Administered 2012-10-10: 5 mg via RESPIRATORY_TRACT
  Filled 2012-10-10: qty 1

## 2012-10-10 MED ORDER — METHYLPREDNISOLONE SODIUM SUCC 125 MG IJ SOLR: 125.0000 mg | Freq: Once | INTRAMUSCULAR | Status: DC

## 2012-10-10 NOTE — ED Notes (Signed)
Patient states he started having SOB x one week ago and worse today.

## 2012-10-10 NOTE — ED Notes (Addendum)
Patient experiencing SOB and is unable/unwilling to answer many questions. Patient states SOB x one week and states he does not know why. Patient denies chest pain and denies fever. Patient also c/o abdominal pain when he coughs. Patient placed on pulse ox and 2L oxygen with sats of 96%.

## 2012-10-10 NOTE — ED Provider Notes (Signed)
CSN: [id number]     Arrival date & time 10/10/12  4098 History     First MD Initiated Contact with Patient 10/10/12 [phone]     Chief Complaint  Patient presents with  . Shortness of Breath   (Consider location/radiation/quality/duration/timing/severity/associated sxs/prior Treatment) HPI  Dylan Duncan is a 57 y.o.male with a significant PMH of hepatitis C, asthma, acid reflux, COPD, emphysema, PTSD  presents to the ER with complaints of COPD exacerbation. He says it started 1 week ago. He takes albuterol, advair, norco and protonix at home and they have not been helping. He has quite smoking but occasionally gets a flair up. He is speaking in complete sentences and does not appear in any distress.  He does not wish to answer many questions which makes obtaining HPI and ROS difficult. Denies knowing cause for exacerbation, has been coughing more than normal.   Past Medical History  Diagnosis Date  . Asthma   . 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 Acid reflux   . COPD (chronic obstructive pulmonary disease)     ?Uses Adviar?  . Emphysema of lung   . Post traumatic stress disorder (PTSD)    Past Surgical History  Procedure Laterality Date  . Mandible surgery    . Arm surgery Right   . Esophagogastroduodenoscopy N/A 06/27/2012    Procedure: ESOPHAGOGASTRODUODENOSCOPY (EGD);  Surgeon: [name], MD;  Location: [facility] ENDOSCOPY;  Service: Endoscopy;  Laterality: N/A;   History reviewed. No pertinent family history. History  Substance Use Topics  . Smoking status: Former Smoker -- 0.14 packs/day for 4 years    Types: Cigarettes    Quit date: 03/17/2010  . Smokeless tobacco: Never Used  . Alcohol Use: 1.2 oz/week    2 Cans of beer per week     Comment: Daily.    Review of Systems  Respiratory: Positive for cough, shortness of breath and wheezing.   All other systems reviewed and are negative.    Allergies  Lactose  intolerance (gi) and Penicillins  Home Medications   Current Outpatient Rx  Name  Route  Sig  Dispense  Refill  . albuterol (PROVENTIL HFA;VENTOLIN HFA) 108 (90 BASE) MCG/ACT inhaler   Inhalation   Inhale 2 puffs into the lungs every 6 (six) hours as needed for wheezing.         . Fluticasone-Salmeterol (ADVAIR) 250-50 MCG/DOSE AEPB   Inhalation   Inhale 1 puff into the lungs every 12 (twelve) hours.         Marland Kitchen HYDROcodone-acetaminophen (NORCO) 10-325 MG per tablet   Oral   Take 1 tablet by mouth every 8 (eight) hours as needed for pain.         . pantoprazole (PROTONIX) 40 MG tablet   Oral   Take 40 mg by mouth 2 (two) times daily.         . predniSONE (DELTASONE) 10 MG tablet   Oral   Take 2 tablets (20 mg total) by mouth daily.   21 tablet   0     Prednisone dose pack directions:   6 tabs on day ...    BP 98/74  Pulse 90  Temp(Src) 98.9 F (37.2 C) (Oral)  Resp 18  Ht 6' (1.829 m)  Wt 130 lb (58.968 kg)  BMI 17.63 kg/m2  SpO2 100% Physical Exam  Nursing note and vitals reviewed. Constitutional: He appears well-developed and  well-nourished. No distress.  HENT:  Head: Normocephalic and atraumatic.  Eyes: Pupils are equal, round, and reactive to light.  Neck: Normal range of motion. Neck supple.  Cardiovascular: Normal rate and regular rhythm.   Pulmonary/Chest: No accessory muscle usage. Tachypnea noted. No respiratory distress. He has wheezes. He has rales.  Abdominal: Soft.  Neurological: He is alert.  Skin: Skin is warm and dry.    ED Course   Procedures (including critical care time)  Labs Reviewed  BASIC METABOLIC PANEL  CBC WITH DIFFERENTIAL   Dg Chest 2 View  10/10/2012   *RADIOLOGY REPORT*  Clinical Data: COPD exacerbation  CHEST - 2 VIEW  Comparison: January 01, 2012  Findings:  There is underlying emphysematous change.  There is bilateral apical scarring, stable.  There is no edema or consolidation.  The heart size is normal.   Pulmonary vascularity reflects underlying emphysema.  No adenopathy.  IMPRESSION: Emphysema with bilateral apical scarring.  No edema or consolidation.   Original Report Authenticated By: Bretta Bang, M.D.   1. COPD exacerbation     MDM  Orders: Solumedrol IM, Alb/Atrovent Neb Labs/images: chest xray 2 view, cbc, bmp  Pt refuses blood work or solumedrol shot. Will take PO prednisone. Pt no longer wants to stay and would like to sign out AMA. He tells me he hasnt eaten yet today and is very hungry.  I asked him if something bad happened, if he was upset or if I could do something to make him stays and he declines to say.  I feel that the patient needs further evaluation, blood work and more breathing treatments.  I let him know the risk of signing out and he has agreed to sign papers stating he understands these risks.   Dorthula Matas, PA-C 10/10/12 1130

## 2012-10-10 NOTE — ED Provider Notes (Signed)
Medical screening examination/treatment/procedure(s) were performed by non-physician practitioner and as supervising physician I was immediately available for consultation/collaboration.   Lyanne Co, MD 10/10/12 1130

## 2012-10-10 NOTE — ED Notes (Signed)
Patient states he is feeling better and breathing better without SOB or laboring to breath.

## 2013-10-18 ENCOUNTER — Encounter (HOSPITAL_COMMUNITY): Payer: Self-pay | Admitting: Emergency Medicine

## 2013-10-18 ENCOUNTER — Emergency Department (HOSPITAL_COMMUNITY): Payer: Medicaid Other

## 2013-10-18 ENCOUNTER — Emergency Department (HOSPITAL_COMMUNITY)
Admission: EM | Admit: 2013-10-18 | Discharge: 2013-10-18 | Disposition: A | Discharge status: Home / Self Care | Payer: Medicaid Other | Attending: Emergency Medicine | Admitting: Emergency Medicine

## 2013-10-18 DIAGNOSIS — Z880 Allergy status to penicillin: Secondary | ICD-10-CM | POA: Diagnosis not present

## 2013-10-18 DIAGNOSIS — S59919A Unspecified injury of unspecified forearm, initial encounter: Secondary | ICD-10-CM

## 2013-10-18 DIAGNOSIS — R51 Headache: Secondary | ICD-10-CM | POA: Insufficient documentation

## 2013-10-18 DIAGNOSIS — IMO0002 Reserved for concepts with insufficient information to code with codable children: Secondary | ICD-10-CM | POA: Insufficient documentation

## 2013-10-18 DIAGNOSIS — S41009A Unspecified open wound of unspecified shoulder, initial encounter: Secondary | ICD-10-CM | POA: Insufficient documentation

## 2013-10-18 DIAGNOSIS — S81809A Unspecified open wound, unspecified lower leg, initial encounter: Secondary | ICD-10-CM

## 2013-10-18 DIAGNOSIS — S51009A Unspecified open wound of unspecified elbow, initial encounter: Secondary | ICD-10-CM | POA: Diagnosis not present

## 2013-10-18 DIAGNOSIS — K219 Gastro-esophageal reflux disease without esophagitis: Secondary | ICD-10-CM | POA: Insufficient documentation

## 2013-10-18 DIAGNOSIS — J449 Chronic obstructive pulmonary disease, unspecified: Secondary | ICD-10-CM | POA: Insufficient documentation

## 2013-10-18 DIAGNOSIS — S91009A Unspecified open wound, unspecified ankle, initial encounter: Secondary | ICD-10-CM | POA: Diagnosis not present

## 2013-10-18 DIAGNOSIS — Y92410 Unspecified street and highway as the place of occurrence of the external cause: Secondary | ICD-10-CM | POA: Insufficient documentation

## 2013-10-18 DIAGNOSIS — S51011A Laceration without foreign body of right elbow, initial encounter: Secondary | ICD-10-CM

## 2013-10-18 DIAGNOSIS — Z79899 Other long term (current) drug therapy: Secondary | ICD-10-CM | POA: Diagnosis not present

## 2013-10-18 DIAGNOSIS — S59909A Unspecified injury of unspecified elbow, initial encounter: Secondary | ICD-10-CM | POA: Insufficient documentation

## 2013-10-18 DIAGNOSIS — Z8619 Personal history of other infectious and parasitic diseases: Secondary | ICD-10-CM | POA: Diagnosis not present

## 2013-10-18 DIAGNOSIS — Y9389 Activity, other specified: Secondary | ICD-10-CM | POA: Insufficient documentation

## 2013-10-18 DIAGNOSIS — S6990XA Unspecified injury of unspecified wrist, hand and finger(s), initial encounter: Secondary | ICD-10-CM

## 2013-10-18 DIAGNOSIS — S81009A Unspecified open wound, unspecified knee, initial encounter: Secondary | ICD-10-CM | POA: Insufficient documentation

## 2013-10-18 DIAGNOSIS — Z87891 Personal history of nicotine dependence: Secondary | ICD-10-CM | POA: Insufficient documentation

## 2013-10-18 DIAGNOSIS — J4489 Other specified chronic obstructive pulmonary disease: Secondary | ICD-10-CM | POA: Insufficient documentation

## 2013-10-18 MED ORDER — BACITRACIN 500 UNIT/GM EX OINT: 1.0000 "application " | TOPICAL_OINTMENT | Freq: Two times a day (BID) | CUTANEOUS | Status: DC

## 2013-10-18 MED ORDER — MORPHINE SULFATE 4 MG/ML IJ SOLN
4.0000 mg | Freq: Once | INTRAMUSCULAR | Status: AC
  Administered 2013-10-18: 4 mg via INTRAVENOUS
  Filled 2013-10-18: qty 1

## 2013-10-18 NOTE — ED Notes (Signed)
Pt taken to CT.
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
## 2013-10-18 NOTE — ED Notes (Signed)
Abrasions cleaned and covered with bacitracin. Bandages in place over ointment. Wound care instructions discussed. Pt has no further questions.

## 2013-10-18 NOTE — ED Notes (Signed)
Resident at bedside performing suture care. Pt tolerating without difficulty. Vital signs stable. No signs of distress noted. Pt remains alert and oriented x4.

## 2013-10-18 NOTE — ED Notes (Signed)
Dr. Loretha Staplerwofford and resident at bedside to suture

## 2013-10-18 NOTE — ED Notes (Signed)
Dr. Wofford at the bedside.  
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
## 2013-10-18 NOTE — ED Notes (Signed)
Resident at the bedside numbing wound.
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
## 2013-10-19 NOTE — ED Provider Notes (Signed)
I saw and evaluated the patient, reviewed the resident's note and I agree with the findings and plan.   EKG Interpretation None        Candyce ChurnJohn David [place] III, MD 10/19/13 2149

## 2014-06-27 ENCOUNTER — Encounter (HOSPITAL_COMMUNITY): Payer: Self-pay

## 2014-06-27 ENCOUNTER — Emergency Department (HOSPITAL_COMMUNITY)
Admission: EM | Admit: 2014-06-27 | Discharge: 2014-06-27 | Disposition: A | Discharge status: Home / Self Care | Payer: Medicaid Other | Attending: Emergency Medicine | Admitting: Emergency Medicine

## 2014-06-27 DIAGNOSIS — J45909 Unspecified asthma, uncomplicated: Secondary | ICD-10-CM | POA: Diagnosis not present

## 2014-06-27 DIAGNOSIS — Z792 Long term (current) use of antibiotics: Secondary | ICD-10-CM | POA: Insufficient documentation

## 2014-06-27 DIAGNOSIS — H6123 Impacted cerumen, bilateral: Secondary | ICD-10-CM | POA: Diagnosis not present

## 2014-06-27 DIAGNOSIS — Z8719 Personal history of other diseases of the digestive system: Secondary | ICD-10-CM | POA: Insufficient documentation

## 2014-06-27 DIAGNOSIS — Z87891 Personal history of nicotine dependence: Secondary | ICD-10-CM | POA: Insufficient documentation

## 2014-06-27 DIAGNOSIS — Z8659 Personal history of other mental and behavioral disorders: Secondary | ICD-10-CM | POA: Diagnosis not present

## 2014-06-27 DIAGNOSIS — J449 Chronic obstructive pulmonary disease, unspecified: Secondary | ICD-10-CM | POA: Insufficient documentation

## 2014-06-27 DIAGNOSIS — Z880 Allergy status to penicillin: Secondary | ICD-10-CM | POA: Insufficient documentation

## 2014-06-27 DIAGNOSIS — Z8619 Personal history of other infectious and parasitic diseases: Secondary | ICD-10-CM | POA: Diagnosis not present

## 2014-06-27 DIAGNOSIS — H9203 Otalgia, bilateral: Secondary | ICD-10-CM | POA: Diagnosis present

## 2014-06-27 NOTE — Discharge Instructions (Signed)
Cerumen Impaction °A cerumen impaction is when the wax in your ear forms a plug. This plug usually causes reduced hearing. Sometimes it also causes an earache or dizziness. Removing a cerumen impaction can be difficult and painful. The wax sticks to the ear canal. The canal is sensitive and bleeds easily. If you try to remove a heavy wax buildup with a cotton tipped swab, you may push it in further. °Irrigation with water, suction, and small ear curettes may be used to clear out the wax. If the impaction is fixed to the skin in the ear canal, ear drops may be needed for a few days to loosen the wax. People who build up a lot of wax frequently can use ear wax removal products available in your local drugstore. °SEEK MEDICAL CARE IF:  °You develop an earache, increased hearing loss, or marked dizziness. °Document Released: 04/10/2004 Document Revised: 05/26/2011 Document Reviewed: 05/31/2009 °Exit[employer]® Patient Information ©2015 [employer]. This information is not intended to replace advice given to you by your health care provider. Make sure you discuss any questions you have with your health care provider. ° °

## 2014-06-27 NOTE — ED Notes (Signed)
Pt verbalized understanding of d/c instructions and has no further questions.  

## 2014-06-27 NOTE — ED Notes (Signed)
Ear waxed removed with syringe, saline and peroxide. Pt states "I can hear better and feel better now"

## 2014-06-27 NOTE — ED Notes (Signed)
Pt states that he was trying to clean out his left ear this morning and "it feels like there is a baseball of wax in there" Pt in no acute distress upon arrival to the room.

## 2014-06-27 NOTE — ED Provider Notes (Signed)
CSN: [id number]     Arrival date & time 06/27/14  0548 History   First MD Initiated Contact with Patient 06/27/14 [phone]     Chief Complaint  Patient presents with  . Cerumen Impaction     (Consider location/radiation/quality/duration/timing/severity/associated sxs/prior Treatment) Patient is a 59 y.o. male presenting with ear pain. The history is provided by the patient. No language interpreter was used.  Otalgia Location:  Bilateral Behind ear:  Swelling Quality:  Aching Severity:  Moderate Onset quality:  Gradual Timing:  Constant Progression:  Worsening Chronicity:  New Relieved by:  Nothing Worsened by:  Nothing tried Ineffective treatments:  None tried   Past Medical History  Diagnosis Date  . Asthma   . 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 Acid reflux   . COPD (chronic obstructive pulmonary disease)     ?Uses Adviar?  . Emphysema of lung   . Post traumatic stress disorder (PTSD)    Past Surgical History  Procedure Laterality Date  . Mandible surgery    . Arm surgery Right   . Esophagogastroduodenoscopy N/A 06/27/2012    Procedure: ESOPHAGOGASTRODUODENOSCOPY (EGD);  Surgeon: Petra KubaMarc E Magod, MD;  [facility];  Service: Endoscopy;  Laterality: N/A;   History reviewed. No pertinent family history. History  Substance Use Topics  . Smoking status: Former Smoker -- 0.14 packs/day for 4 years    Types: Cigarettes    Quit date: 03/17/2010  . Smokeless tobacco: Never Used  . Alcohol Use: 1.2 oz/week    2 Cans of beer per week     Comment: Daily.    Review of Systems  HENT: Positive for ear pain.   All other systems reviewed and are negative.     Allergies  Lactose intolerance (gi) and Penicillins  Home Medications   Prior to Admission medications   Medication Sig Start Date End Date Taking? Authorizing Provider  bacitracin 500 UNIT/GM ointment Apply 1 application topically 2 (two) times  daily. Patient not taking: Reported on 06/27/2014 10/18/13   Lorenda HatchetAdam L Rothman, MD   BP 104/72 mmHg  Pulse 64  Temp(Src) 98 F (36.7 C) (Oral)  Resp 16  SpO2 100% Physical Exam  Constitutional: He is oriented to person, place, and time. He appears well-developed and well-nourished.  HENT:  Head: Normocephalic and atraumatic.  bilat cerumen impactions  Eyes: Pupils are equal, round, and reactive to light.  Neck: Normal range of motion.  Cardiovascular: Normal rate.   Pulmonary/Chest: Effort normal.  Musculoskeletal: Normal range of motion.  Neurological: He is alert and oriented to person, place, and time.  Psychiatric: He has a normal mood and affect.  Nursing note and vitals reviewed.   ED Course  EAR CERUMEN REMOVAL Date/Time: 06/27/2014 7:08 AM Performed by: Elson AreasSOFIA, LESLIE K Authorized by: Elson AreasSOFIA, LESLIE K Consent: Verbal consent obtained. Risks and benefits: risks, benefits and alternatives were discussed Consent given by: patient Time out: Immediately prior to procedure a "time out" was called to verify the correct patient, procedure, equipment, support staff and site/side marked as required. Location details: left ear Procedure type: curette and irrigation Patient sedated: no Patient tolerance: Patient tolerated the procedure well with no immediate complications Comments: Removed cerumen from both ears   (including critical care time) Labs Review Labs Reviewed - No data to display  Imaging Review No results found.   EKG Interpretation None      MDM   Final  diagnoses:  Cerumen impaction, bilateral    avs    Elson Areas, PA-C 06/27/14 5409  Derwood Kaplan, MD 06/27/14 2247

## 2015-10-13 ENCOUNTER — Emergency Department (HOSPITAL_COMMUNITY)
Admission: EM | Admit: 2015-10-13 | Discharge: 2015-10-13 | Disposition: A | Discharge status: Home / Self Care | Payer: Medicaid Other | Attending: Emergency Medicine | Admitting: Emergency Medicine

## 2015-10-13 ENCOUNTER — Encounter (HOSPITAL_COMMUNITY): Payer: Self-pay | Admitting: Emergency Medicine

## 2015-10-13 DIAGNOSIS — M546 Pain in thoracic spine: Secondary | ICD-10-CM | POA: Diagnosis not present

## 2015-10-13 DIAGNOSIS — J449 Chronic obstructive pulmonary disease, unspecified: Secondary | ICD-10-CM | POA: Insufficient documentation

## 2015-10-13 DIAGNOSIS — Z87891 Personal history of nicotine dependence: Secondary | ICD-10-CM | POA: Diagnosis not present

## 2015-10-13 DIAGNOSIS — M79601 Pain in right arm: Secondary | ICD-10-CM | POA: Diagnosis not present

## 2015-10-13 DIAGNOSIS — G8929 Other chronic pain: Secondary | ICD-10-CM | POA: Diagnosis not present

## 2015-10-13 MED ORDER — MELOXICAM 7.5 MG PO TABS: 15.0000 mg | ORAL_TABLET | Freq: Every day | ORAL | 0 refills | Status: DC

## 2015-10-13 MED ORDER — MELOXICAM 7.5 MG PO TABS
7.5000 mg | ORAL_TABLET | Freq: Once | ORAL | Status: AC
  Administered 2015-10-13: 7.5 mg via ORAL
  Filled 2015-10-13: qty 1

## 2015-10-13 NOTE — ED Provider Notes (Signed)
[facility] Provider Note   CSN: [id number] Arrival date & time: 10/13/15  5784  First Provider Contact:  First MD Initiated Contact with Patient 10/13/15 0622        History   Chief Complaint Chief Complaint  Patient presents with  . Other    wants lab work    HPI Dylan Duncan is a 60 y.o. male.  The history is provided by the patient and medical records.   60 y.o. M with hx of GERD, asthma, COPD, hep C, emphysema, PTSD, here with left sided back pain and right arm pain.  States right arm pain is from an elbow fracture several years ago that never healed right.  States he has chronic pain in this arm for which he takes norco prescribed by his PCP, [name].  States he gets some relief from his medications.  He denies any new injuries, trauma, or falls.  No new numbness or weakness.  Patient is right hand dominant.  States he does have hx of arthritis as well.    Patient also complains of left thoracic back pain. States achy in nature.  No recent trauma or heavy lifting. Patient does admit that he has been sleeping in a recliner lately as his acid reflux has been bothering him. He denies any symptoms of GERD at this time. Takes omeprazole for his GERD but sometimes forgets.  Patient denies any abdominal pain, nausea, vomiting, or diarrhea. He has an appointment with his PCP next week but was concerned his back pain may be due to his liver so he came in today.  Past Medical History:  Diagnosis Date  . Acid reflux   . Asthma   . COPD (chronic obstructive pulmonary disease) (HCC)    ?Uses Adviar?  . Emphysema of lung (HCC)   . 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 Post traumatic stress disorder (PTSD)     Patient Active Problem List   Diagnosis Date Noted  . Asymptomatic gallstones 08/04/2012  . Steatohepatitis, HCV & EtOH 08/04/2012  . History of ETOH abuse 08/04/2012  . Portal hypertensive gastropathy  06/27/2012  . Hematemesis 06/26/2012  . Hepatitis C   . Asthma     Past Surgical History:  Procedure Laterality Date  . arm surgery Right   . ESOPHAGOGASTRODUODENOSCOPY N/A 06/27/2012   Procedure: ESOPHAGOGASTRODUODENOSCOPY (EGD);  Surgeon: [name], MD;  Location[facility];  Service: Endoscopy;  Laterality: N/A;  . MANDIBLE SURGERY         Home Medications    Prior to Admission medications   Medication Sig Start Date End Date Taking? Authorizing Provider  bacitracin 500 UNIT/GM ointment Apply 1 application topically 2 (two) times daily. Patient not taking: Reported on 06/27/2014 10/18/13   Lorenda Hatchet, MD    Family History History reviewed. No pertinent family history.  Social History Social History  Substance Use Topics  . Smoking status: Former Smoker    Packs/day: 0.14    Years: 4.00    Types: Cigarettes    Quit date: 03/17/2010  . Smokeless tobacco: Never Used  . Alcohol use 1.2 oz/week    2 Cans of beer per week     Comment: Daily.     Allergies   Lactose intolerance (gi) and Penicillins   Review of Systems Review of Systems  Genitourinary: Positive for flank pain.  All other systems reviewed and are negative.  Physical Exam Updated Vital Signs BP 135/83 (BP Location: Right Arm)   Pulse 75   Temp 98.2 F (36.8 C) (Oral)   Resp 16   Ht 6\' 1"  (1.854 m)   Wt 59 kg   SpO2 100%   BMI 17.15 kg/m   Physical Exam  Constitutional: He is oriented to person, place, and time. He appears well-developed and well-nourished.  HENT:  Head: Normocephalic and atraumatic.  Mouth/Throat: Oropharynx is clear and moist.  Eyes: Conjunctivae and EOM are normal. Pupils are equal, round, and reactive to light.  No scleral icterus  Neck: Normal range of motion.  Cardiovascular: Normal rate, regular rhythm and normal heart sounds.   Pulmonary/Chest: Effort normal and breath sounds normal.  Abdominal: Soft. Bowel sounds are normal. There is no  hepatosplenomegaly. There is no tenderness. There is no CVA tenderness.  Abdomen soft, benign  Musculoskeletal: Normal range of motion.  Right elbow with chronic deformity, limited ROM (baseline); no visible signs of trauma; normal grip strength; normal sensation throughout  Left thoracic paraspinal region with mild tenderness, no bony deformities, no midline tenderness or step-off; full ROM maintained; normal strength and sensation of both legs; normal gait  Neurological: He is alert and oriented to person, place, and time.  Skin: Skin is warm and dry.  Psychiatric: He has a normal mood and affect.  Nursing note and vitals reviewed.    ED Treatments / Results  Labs (all labs ordered are listed, but only abnormal results are displayed) Labs Reviewed - No data to display  EKG  EKG Interpretation None       Radiology No results found.  Procedures Procedures (including critical care time)  Medications Ordered in ED Medications - No data to display   Initial Impression / Assessment and Plan / ED Course  I have reviewed the triage vital signs and the nursing notes.  Pertinent labs & imaging results that were available during my care of the patient were reviewed by me and considered in my medical decision making (see chart for details).  Clinical Course   6:44 AM Patient seen and evaluated. His right arm pain seems chronic in nature from old fracture, no signs of new injury/trauma today. He also has some left thoracic back pain which is reproducible on exam. He has no CVA tenderness. Abdominal exam is benign. He has no neurologic deficits to suggest cauda equina or other emergent spinal pathology. This is likely musculoskeletal, may be due to him sleeping in a recliner-- do not feel his back pain is related to his liver.  He has not had any nausea or vomiting. He has no scleral icterus, no hepatomegaly. Denies any excessive Tylenol or alcohol use recently.  do not feel he needs  emergent work-up for his known hep C.   Patient does take Norco regularly from his primary care doctor, I've advised him to use this with caution as the Tylenol is likely not good for him given his liver issues. Will start on Mobic for his pain.  FU with PCP this coming week.  Discussed plan with patient, he/she acknowledged understanding and agreed with plan of care.  Return precautions given for new or worsening symptoms.  Final Clinical Impressions(s) / ED Diagnoses   Final diagnoses:  Left-sided thoracic back pain  Arm pain, right    New Prescriptions New Prescriptions   MELOXICAM (MOBIC) 7.5 MG TABLET    Take 2 tablets (15 mg total) by mouth daily.     Rosezella Florida  Allyne Gee, PA-C 10/13/15 0750    Gilda Crease, MD 10/14/15 Burna Mortimer

## 2015-10-13 NOTE — ED Triage Notes (Addendum)
Pt arrives POV stating he "just wants some lab work done." Concerned for liver. C/o of L flank pain & R arm pain that appear chronic in nature. Pt has PCP that cannot see him until next week, so he decided he did not want to wait. Denies acute s/s in triage.

## 2015-10-13 NOTE — Discharge Instructions (Signed)
Take the prescribed medication as directed. As we discussed, use caution when taking the norco as it has tylenol in it which is not good for you liver with your hep C. Monitor your alcoholic intake as well. Follow-up with your primary care doctor. Return to the ED for new or worsening symptoms.

## 2016-01-29 ENCOUNTER — Other Ambulatory Visit (HOSPITAL_COMMUNITY): Payer: Self-pay | Admitting: Nurse Practitioner

## 2016-01-29 DIAGNOSIS — B182 Chronic viral hepatitis C: Secondary | ICD-10-CM

## 2016-02-28 ENCOUNTER — Ambulatory Visit (HOSPITAL_COMMUNITY)
Admission: RE | Admit: 2016-02-28 | Discharge: 2016-02-28 | Disposition: A | Discharge status: Home / Self Care | Payer: Medicaid Other | Source: Ambulatory Visit | Attending: Nurse Practitioner | Admitting: Nurse Practitioner

## 2016-02-28 DIAGNOSIS — B182 Chronic viral hepatitis C: Secondary | ICD-10-CM

## 2016-03-05 ENCOUNTER — Ambulatory Visit (HOSPITAL_COMMUNITY)
Admission: RE | Admit: 2016-03-05 | Discharge: 2016-03-05 | Disposition: A | Discharge status: Home / Self Care | Payer: Medicaid Other | Source: Ambulatory Visit | Attending: Nurse Practitioner | Admitting: Nurse Practitioner

## 2016-03-05 DIAGNOSIS — K760 Fatty (change of) liver, not elsewhere classified: Secondary | ICD-10-CM | POA: Insufficient documentation

## 2016-03-05 DIAGNOSIS — K8020 Calculus of gallbladder without cholecystitis without obstruction: Secondary | ICD-10-CM | POA: Diagnosis not present

## 2016-03-05 DIAGNOSIS — B182 Chronic viral hepatitis C: Secondary | ICD-10-CM | POA: Diagnosis present

## 2016-03-05 DIAGNOSIS — I77811 Abdominal aortic ectasia: Secondary | ICD-10-CM | POA: Diagnosis not present

## 2016-07-30 ENCOUNTER — Other Ambulatory Visit: Payer: Self-pay | Admitting: Nurse Practitioner

## 2016-07-30 DIAGNOSIS — K7469 Other cirrhosis of liver: Secondary | ICD-10-CM

## 2016-09-01 ENCOUNTER — Other Ambulatory Visit: Payer: Medicaid Other

## 2016-09-26 ENCOUNTER — Ambulatory Visit
Admission: RE | Admit: 2016-09-26 | Discharge: 2016-09-26 | Disposition: A | Discharge status: Home / Self Care | Payer: Medicaid Other | Source: Ambulatory Visit | Attending: Nurse Practitioner | Admitting: Nurse Practitioner

## 2016-09-26 DIAGNOSIS — K7469 Other cirrhosis of liver: Secondary | ICD-10-CM

## 2016-10-10 ENCOUNTER — Emergency Department (HOSPITAL_COMMUNITY)
Admission: EM | Admit: 2016-10-10 | Discharge: 2016-10-10 | Disposition: A | Discharge status: Home / Self Care | Payer: Medicaid Other | Attending: Emergency Medicine | Admitting: Emergency Medicine

## 2016-10-10 ENCOUNTER — Encounter (HOSPITAL_COMMUNITY): Payer: Self-pay | Admitting: Emergency Medicine

## 2016-10-10 DIAGNOSIS — E860 Dehydration: Secondary | ICD-10-CM | POA: Diagnosis not present

## 2016-10-10 DIAGNOSIS — J449 Chronic obstructive pulmonary disease, unspecified: Secondary | ICD-10-CM | POA: Diagnosis not present

## 2016-10-10 DIAGNOSIS — Z87891 Personal history of nicotine dependence: Secondary | ICD-10-CM | POA: Insufficient documentation

## 2016-10-10 DIAGNOSIS — Z79899 Other long term (current) drug therapy: Secondary | ICD-10-CM | POA: Insufficient documentation

## 2016-10-10 DIAGNOSIS — J45909 Unspecified asthma, uncomplicated: Secondary | ICD-10-CM | POA: Diagnosis not present

## 2016-10-10 DIAGNOSIS — M791 Myalgia: Secondary | ICD-10-CM | POA: Diagnosis present

## 2016-10-10 LAB — URINALYSIS, ROUTINE W REFLEX MICROSCOPIC
BILIRUBIN URINE: NEGATIVE
Glucose, UA: NEGATIVE mg/dL
Hgb urine dipstick: NEGATIVE
Ketones, ur: NEGATIVE mg/dL
Nitrite: NEGATIVE
Protein, ur: 30 mg/dL — AB
SPECIFIC GRAVITY, URINE: 1.015 (ref 1.005–1.030)
pH: 5 (ref 5.0–8.0)

## 2016-10-10 LAB — CBC
HEMATOCRIT: 31 % — AB (ref 39.0–52.0)
HEMOGLOBIN: 10.2 g/dL — AB (ref 13.0–17.0)
MCH: 26.6 pg (ref 26.0–34.0)
MCHC: 32.9 g/dL (ref 30.0–36.0)
MCV: 80.9 fL (ref 78.0–100.0)
Platelets: 188 10*3/uL (ref 150–400)
RBC: 3.83 MIL/uL — ABNORMAL LOW (ref 4.22–5.81)
RDW: 18.6 % — ABNORMAL HIGH (ref 11.5–15.5)
WBC: 8.4 10*3/uL (ref 4.0–10.5)

## 2016-10-10 LAB — BASIC METABOLIC PANEL
Anion gap: 13 (ref 5–15)
BUN: 20 mg/dL (ref 6–20)
CHLORIDE: 101 mmol/L (ref 101–111)
CO2: 22 mmol/L (ref 22–32)
CREATININE: 1.4 mg/dL — AB (ref 0.61–1.24)
Calcium: 10.2 mg/dL (ref 8.9–10.3)
GFR calc Af Amer: >60 mL/min (ref >60)
GFR calc non Af Amer: 53 mL/min — ABNORMAL LOW (ref >60)
Glucose, Bld: 164 mg/dL — ABNORMAL HIGH (ref 65–99)
Potassium: 3.9 mmol/L (ref 3.5–5.1)
SODIUM: 136 mmol/L (ref 135–145)

## 2016-10-10 LAB — CK: Total CK: 228 U/L (ref 49–397)

## 2016-10-10 NOTE — ED Triage Notes (Signed)
See previous note, pt left AMA bc he didn't want blood drawn. Pt decided to come back and agreed to blood work this time.

## 2016-10-10 NOTE — ED Triage Notes (Signed)
Pt reports bilateral hand/leg cramping that started approx 4pm. Pt also endorses N/V. States he has been working out in heat, has not been eating/drinking well.

## 2016-10-10 NOTE — ED Notes (Signed)
Pt came by nurse first and states he was leaving because "the needle was too big" when they tried to get blood work.  Pt refused to stay.

## 2016-10-11 ENCOUNTER — Emergency Department (HOSPITAL_COMMUNITY)
Admission: EM | Admit: 2016-10-11 | Discharge: 2016-10-11 | Disposition: A | Discharge status: Home / Self Care | Payer: Medicaid Other | Source: Home / Self Care | Attending: Emergency Medicine | Admitting: Emergency Medicine

## 2016-10-11 DIAGNOSIS — E860 Dehydration: Secondary | ICD-10-CM

## 2016-10-11 MED ORDER — SODIUM CHLORIDE 0.9 % IV BOLUS (SEPSIS)
1000.0000 mL | Freq: Once | INTRAVENOUS | Status: AC
  Administered 2016-10-11: 1000 mL via INTRAVENOUS

## 2016-10-11 NOTE — ED Provider Notes (Signed)
[facility] Provider Note   CSN: [id number] Arrival date & time: 10/10/16  2226  By signing my name below, I, Dylan Duncan, attest that this documentation has been prepared under the direction and in the presence of Dylan Duncan, Dylan Duncan, *. Electronically Signed: Karren CobbleNy'Kea Duncan, ED Scribe. 10/11/16. 12:51 AM.  History   Chief Complaint Chief Complaint  Patient presents with  . Body Cramping   The history is provided by the patient. No language interpreter was used.    HPI HPI Comments: Dylan Duncan A Greulich is a 61 y.o. male with a history of COPD, emphysema, and Hep C, who presents to the Emergency Department complaining of persistent myalgias. He notes associated abdominal cramping. Pt reports he does work out in the sun and has been doing a generous amount lately. Pt denies shortness of breath or any acute associated symptoms at this time.   Past Medical History:  Diagnosis Date  . Acid reflux   . Asthma   . COPD (chronic obstructive pulmonary disease) (HCC)    ?Uses Adviar?  . Emphysema of lung (HCC)   . 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 Post traumatic stress disorder (PTSD)    Patient Active Problem List   Diagnosis Date Noted  . Asymptomatic gallstones 08/04/2012  . Steatohepatitis, HCV & EtOH 08/04/2012  . History of ETOH abuse 08/04/2012  . Portal hypertensive gastropathy (HCC) 06/27/2012  . Hematemesis 06/26/2012  . Hepatitis C   . Asthma    Past Surgical History:  Procedure Laterality Date  . arm surgery Right   . ESOPHAGOGASTRODUODENOSCOPY N/A 06/27/2012   Procedure: ESOPHAGOGASTRODUODENOSCOPY (EGD);  Surgeon: Petra KubaMarc E Magod, MD;  [facility];  Service: Endoscopy;  Laterality: N/A;  . MANDIBLE SURGERY      Home Medications    Prior to Admission medications   Medication Sig Start Date End Date Taking? Authorizing Provider  ADVAIR DISKUS 100-50 MCG/DOSE AEPB Take 1 puff by mouth 2 (two) times  daily. 09/10/16  Yes [provider]  bacitracin 500 UNIT/GM ointment Apply 1 application topically 2 (two) times daily. Patient not taking: Reported on 06/27/2014 10/18/13   Lorenda Duncan, Dylan L, MD  meloxicam (MOBIC) 7.5 MG tablet Take 2 tablets (15 mg total) by mouth daily. Patient not taking: Reported on 10/11/2016 10/13/15   Dylan Duncan, Dylan M, PA-C   Family History No family history on file.  Social History Social History  Substance Use Topics  . Smoking status: Former Smoker    Packs/day: 0.14    Years: 4.00    Types: Cigarettes    Quit date: 03/17/2010  . Smokeless tobacco: Never Used  . Alcohol use 1.2 oz/week    2 Cans of beer per week     Comment: Daily.   Allergies   Lactose intolerance (gi) and Penicillins  Review of Systems Review of Systems  Respiratory: Negative for shortness of breath.   Gastrointestinal: Positive for abdominal pain. Abdominal distention: cramping.  Musculoskeletal: Positive for myalgias.  All other systems reviewed and are negative.  Physical Exam Updated Vital Signs BP 131/82   Pulse 72   Temp 98.2 F (36.8 C) (Oral)   Resp 16   Ht 6\' 1"  (1.854 Duncan)   Wt 59 kg (130 lb)   SpO2 100%   BMI 17.15 kg/Duncan   Physical Exam  Constitutional: He is oriented to person, place, and time. He appears well-developed and well-nourished. No distress.  HENT:  Head: Normocephalic and atraumatic.  Right Ear: Hearing normal.  Left Ear: Hearing normal.  Nose: Nose normal.  Mouth/Throat: Oropharynx is clear and moist and mucous membranes are normal.  Eyes: Pupils are equal, round, and reactive to light. Conjunctivae and EOM are normal.  Neck: Normal range of motion. Neck supple.  Cardiovascular: Regular rhythm, S1 normal and S2 normal.  Exam reveals no gallop and no friction rub.   No murmur heard. Pulmonary/Chest: Effort normal and breath sounds normal. No respiratory distress. He exhibits no tenderness.  Abdominal: Soft. Normal appearance and bowel sounds  are normal. There is no hepatosplenomegaly. There is no tenderness. There is no rebound, no guarding, no tenderness at McBurney's point and negative Murphy's sign. No hernia.  Musculoskeletal: Normal range of motion.  Neurological: He is alert and oriented to person, place, and time. He has normal strength. No cranial nerve deficit or sensory deficit. Coordination normal. GCS eye subscore is 4. GCS verbal subscore is 5. GCS motor subscore is 6.  Skin: Skin is warm, dry and intact. No rash noted. No cyanosis.  Psychiatric: He has a normal mood and affect. His speech is normal and behavior is normal. Thought content normal.  Nursing note and vitals reviewed.   ED Treatments / Results  DIAGNOSTIC STUDIES: Oxygen Saturation is 99%, RA, normal by my interpretation.   COORDINATION OF CARE: 12:44 AM-Discussed next steps with pt. Pt verbalized understanding and is agreeable with the plan.   Labs (all labs ordered are listed, but only abnormal results are displayed) Labs Reviewed - No data to display  EKG  EKG Interpretation None      Radiology No results found.  Procedures Procedures (including critical care time)  Medications Ordered in ED Medications  sodium chloride 0.9 % bolus 1,000 mL (0 mLs Intravenous Stopped 10/11/16 0204)     Initial Impression / Assessment and Plan / ED Course  I have reviewed the triage vital signs and the nursing notes.  Pertinent labs & imaging results that were available during my care of the patient were reviewed by me and considered in my medical decision making (see chart for details).     Patient presents to the emergency room with complaints of diffuse body cramping. He works outside in the heat. He reports he has not been taking in enough liquids. He has had some nausea and vomiting. Blood work shows slightly elevated creatinine, suspect dehydration. Electrolytes unremarkable. Patient's examination is otherwise well-appearing. He does not have  any abdominal tenderness or signs of acute surgical process. Vital signs are normal. He did have very slight borderline tachycardia initially, this has resolved with IV fluids. He is tolerating oral intake here in the ER.  Final Clinical Impressions(s) / ED Diagnoses   Final diagnoses:  Dehydration    New Prescriptions New Prescriptions   No medications on file  I personally performed the services described in this documentation, which was scribed in my presence. The recorded information has been reviewed and is accurate.     Gilda CreasePollina, Dameion Briles J, MD 10/11/16 [phone]

## 2016-10-11 NOTE — ED Notes (Signed)
Pt verbalized understanding of discharge instructions.

## 2017-01-20 ENCOUNTER — Ambulatory Visit: Payer: Self-pay | Admitting: Gastroenterology

## 2017-02-26 ENCOUNTER — Encounter: Payer: Self-pay | Admitting: Gastroenterology

## 2017-03-24 ENCOUNTER — Encounter: Payer: Self-pay | Admitting: Gastroenterology

## 2017-03-24 ENCOUNTER — Other Ambulatory Visit (INDEPENDENT_AMBULATORY_CARE_PROVIDER_SITE_OTHER): Payer: Medicaid Other

## 2017-03-24 ENCOUNTER — Ambulatory Visit: Payer: Medicaid Other | Admitting: Gastroenterology

## 2017-03-24 VITALS — BP 102/58 | HR 60 | Ht 74.0 in | Wt 127.5 lb

## 2017-03-24 DIAGNOSIS — D649 Anemia, unspecified: Secondary | ICD-10-CM | POA: Diagnosis not present

## 2017-03-24 DIAGNOSIS — R1310 Dysphagia, unspecified: Secondary | ICD-10-CM | POA: Diagnosis not present

## 2017-03-24 DIAGNOSIS — R634 Abnormal weight loss: Secondary | ICD-10-CM

## 2017-03-24 DIAGNOSIS — Z8619 Personal history of other infectious and parasitic diseases: Secondary | ICD-10-CM | POA: Diagnosis not present

## 2017-03-24 DIAGNOSIS — K625 Hemorrhage of anus and rectum: Secondary | ICD-10-CM | POA: Diagnosis not present

## 2017-03-24 DIAGNOSIS — R1319 Other dysphagia: Secondary | ICD-10-CM

## 2017-03-24 DIAGNOSIS — K7460 Unspecified cirrhosis of liver: Secondary | ICD-10-CM

## 2017-03-24 LAB — CBC WITH DIFFERENTIAL/PLATELET
BASOS PCT: 1.2 % (ref 0.0–3.0)
Basophils Absolute: 0.1 10*3/uL (ref 0.0–0.1)
EOS ABS: 0.2 10*3/uL (ref 0.0–0.7)
Eosinophils Relative: 2.4 % (ref 0.0–5.0)
HEMATOCRIT: 39.2 % (ref 39.0–52.0)
Hemoglobin: 12.9 g/dL — ABNORMAL LOW (ref 13.0–17.0)
Lymphocytes Relative: 28.4 % (ref 12.0–46.0)
Lymphs Abs: 1.9 10*3/uL (ref 0.7–4.0)
MCHC: 32.9 g/dL (ref 30.0–36.0)
MCV: 96.8 fl (ref 78.0–100.0)
MONOS PCT: 10.8 % (ref 3.0–12.0)
Monocytes Absolute: 0.7 10*3/uL (ref 0.1–1.0)
NEUTROS ABS: 3.8 10*3/uL (ref 1.4–7.7)
Neutrophils Relative %: 57.2 % (ref 43.0–77.0)
Platelets: 186 10*3/uL (ref 150.0–400.0)
RBC: 4.05 Mil/uL — ABNORMAL LOW (ref 4.22–5.81)
RDW: 15.4 % (ref 11.5–15.5)
WBC: 6.7 10*3/uL (ref 4.0–10.5)

## 2017-03-24 MED ORDER — PEG-KCL-NACL-NASULF-NA ASC-C 140 G PO SOLR: 140.0000 g | ORAL | 0 refills | Status: DC

## 2017-03-24 NOTE — Progress Notes (Signed)
[facility] Consult Note:  History: Dylan Duncan 03/24/2017  Referring physician: Annamarie Major, NP ([facility])  Reason for consult/chief complaint: Gastroesophageal Reflux (has burning sensation with choking. Tries to avoid eating late at night); Anemia (Per Hep C Dr); Weight Loss (Normally weighs about 135-140lbs); Constipation (Has hard stools and can see BRB with wiping); and Colon Cancer Screening (Unsure when last colon was)   Subjective  HPI:  This is a 62 year old man referred by the local hepatology clinic for a constellation of symptoms.  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  Hep C was cleared with treatment earlier this year, and he says he cut back his alcohol use to 140 ounce beer per day.  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 At last hepatology clinic visit in August 2018, Dylan Duncan was reportedly anemic for unclear reasons, and he described frequent heartburn symptoms especially in the evening along with intermittent solid food dysphagia.  He has intermittent constipation when this occurs he will see some blood on the toilet paper.  He does not believe he is ever had a colonoscopy.  He denies a family history of esophageal, gastric or colon cancer.   ROS:  Review of Systems  Constitutional: Negative for appetite change and unexpected weight change.  HENT: Negative for mouth sores and voice change.   Eyes: Negative for pain and redness.  Respiratory: Positive for shortness of breath. Negative for cough.   Cardiovascular: Negative for chest pain and palpitations.  Genitourinary: Negative for dysuria and hematuria.  Musculoskeletal: Negative for arthralgias and myalgias.  Skin: Negative for pallor and rash.  Neurological: Negative for weakness and headaches.  Hematological:  Negative for adenopathy.     Past Medical History: Past Medical History:  Diagnosis Date  . Acid reflux   . Asthma   . COPD (chronic obstructive pulmonary disease) (HCC)    ?Uses Adviar?  . Emphysema of lung (HCC)   . GERD (gastroesophageal reflux disease)   . Hepatic steatosis   . 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 Liver fibrosis    fibrotest F4,elastography F2-F3  . Post traumatic stress disorder (PTSD)      Past Surgical History: Past Surgical History:  Procedure Laterality Date  . arm surgery Right   . ESOPHAGOGASTRODUODENOSCOPY N/A 06/27/2012   Procedure: ESOPHAGOGASTRODUODENOSCOPY (EGD);  Surgeon: [name], MD;  [facility]: [facility] END[facility]COPY;  Service: Endoscopy;  Laterality: N/A;  . MANDIBLE SURGERY       Family History: Family History  Problem Relation Age of Onset  . Pneumonia Mother   . Heart disease Father        Hx of MI    Social History: Social History   Socioeconomic History  . Marital status: Single    Spouse name: None  . Number of children: None  . Years of education: None  . Highest education level: None  Social Needs  . Financial resource strain: None  . Food insecurity - worry: None  . Food insecurity - inability: None  . Transportation needs - medical: None  . Transportation needs - non-medical: None  Occupational History  . Occupation: disabled    Comment: former Set designer  Tobacco Use  . Smoking status: Former Smoker    Packs/day: 0.14    Years: 4.00  Pack years: 0.56    Types: Cigarettes    Last attempt to quit: 03/17/2010    Years since quitting: 7.0  . Smokeless tobacco: Never Used  Substance and Sexual Activity  . Alcohol use: Yes    Alcohol/week: 1.2 oz    Types: 2 Cans of beer per week    Comment: Daily.  . Drug use: Yes    Frequency: 2.0 times per week    Types: Marijuana  . Sexual activity: Yes    Birth control/protection: Condom  Other Topics Concern  .  None  Social History Narrative  . None    Allergies: Allergies  Allergen Reactions  . Lactose Intolerance (Gi)   . Penicillins Itching    Outpatient Meds: Current Outpatient Medications  Medication Sig Dispense Refill  . cetirizine (ZYRTEC) 10 MG tablet Take 10 mg by mouth daily.    . ranitidine (ZANTAC) 75 MG tablet Take 75 mg by mouth daily.    Marland Kitchen PEG-KCl-NaCl-NaSulf-Na Asc-C (PLENVU) 140 g SOLR Take 140 g by mouth as directed. 1 each 0   No current facility-administered medications for this visit.       ___________________________________________________________________ Objective   Exam:  BP (!) 102/58   Pulse 60   Ht 6\' 2"  (1.88 m)   Wt 127 lb 8 oz (57.8 kg)   BMI 16.37 kg/m    General: this is a(n) thin AA man   Eyes: sclera anicteric, no redness  ENT: oral mucosa moist without lesions, no cervical or supraclavicular lymphadenopathy, good dentition  CV: RRR without murmur, S1/S2, no JVD, no peripheral edema  Resp: clear to auscultation bilaterally, normal RR and effort noted  GI: soft, no tenderness, with active bowel sounds. No guarding or palpable organomegaly noted.  Skin; warm and dry, no rash or jaundice noted  Neuro: awake, alert and oriented x 3. Normal gross motor function and fluent speech Uncooperative with DRE - anal canal and distal rectum nml.  Firm stool - heme neg  Labs:  According to his October 22, 2016 hepatology clinic note, labs from July 23 showed WBC 5.5, hemoglobin 10.6, platelets 189, INR 1.0, creatinine 0.8, LFTs normal, albumin 4.6, HCV RNA undetected. He had a liver fibro-test prior to hepatitis C therapy estimated grade 4 fibrosis and elastrography  Right upper quadrant ultrasound 09/26/2016 shows gallstones with no biliary ductal dilatation and probable fatty liver with F2 to F3.  CBC Latest Ref Rng & Units 03/24/2017 10/10/2016 06/27/2012  WBC 4.0 - 10.5 K/uL 6.7 8.4 3.7(L)  Hemoglobin 13.0 - 17.0 g/dL 12.9(L) 10.2(L) 12.1(L)    Hematocrit 39.0 - 52.0 % 39.2 31.0(L) 34.9(L)  Platelets 150.0 - 400.0 K/uL 186.0 188 106(L)   I sent him for this CBC after the clinic visit and its results returned later in the day.  Assessment: Encounter Diagnoses  Name Primary?  . Cirrhosis of liver without ascites, unspecified hepatic cirrhosis type (HCC) Yes  . History of hepatitis C   . Rectal bleeding   . Esophageal dysphagia   . Abnormal loss of weight   . Anemia, unspecified type     His test results are somewhat discordant, but his hepatologist reports that he has cirrhosis.  He should therefore be screened for varices, but I suspect he does not have them with a normal platelet count.  His previous anemia is of unclear cause and is now resolved.  I wonder if he could have had alcohol related bone marrow suppression. However, he has frequent heartburn, dysphagia and  some anal bleeding.  I advised him to have a EGD and colonoscopy, and he is agreeable after a thorough discussion of procedure and risks.  The benefits and risks of the planned procedure were described in detail with the patient or (when appropriate) their health care proxy.  Risks were outlined as including, but not limited to, bleeding, infection, perforation, adverse medication reaction leading to cardiac or pulmonary decompensation, or pancreatitis (if ERCP).  The limitation of incomplete mucosal visualization was also discussed.  No guarantees or warranties were given.   Thank you for the courtesy of this consult.  Please call me with any questions or concerns.  Charlie PitterHenry L Danis III  CC: Annamarie Majorawn Drazek, NP

## 2017-03-24 NOTE — Patient Instructions (Signed)
If you are age 63 or older, your body mass index should be between 23-30. Your Body mass index is 16.37 kg/m. If this is out of the aforementioned range listed, please consider follow up with your Primary Care Provider.  If you are age 63 or younger, your body mass index should be between 19-25. Your Body mass index is 16.37 kg/m. If this is out of the aformentioned range listed, please consider follow up with your Primary Care Provider.   You have been scheduled for an endoscopy and colonoscopy. Please follow the written instructions given to you at your visit today. Please pick up your prep supplies at the pharmacy within the next 1-3 days. If you use inhalers (even only as needed), please bring them with you on the day of your procedure. Your physician has requested that you go to www.startemmi.com and enter the access code given to you at your visit today. This web site gives a general overview about your procedure. However, you should still follow specific instructions given to you by our office regarding your preparation for the procedure.  Thank you for choosing [facility]  Dr Amada JupiterHenry Danis III

## 2017-04-06 ENCOUNTER — Emergency Department (HOSPITAL_COMMUNITY)
Admission: EM | Admit: 2017-04-06 | Discharge: 2017-04-06 | Disposition: A | Discharge status: Home / Self Care | Payer: Medicaid Other | Attending: Emergency Medicine | Admitting: Emergency Medicine

## 2017-04-06 ENCOUNTER — Emergency Department (HOSPITAL_COMMUNITY): Payer: Medicaid Other

## 2017-04-06 ENCOUNTER — Encounter (HOSPITAL_COMMUNITY): Payer: Self-pay | Admitting: Emergency Medicine

## 2017-04-06 ENCOUNTER — Other Ambulatory Visit: Payer: Self-pay

## 2017-04-06 DIAGNOSIS — Z79899 Other long term (current) drug therapy: Secondary | ICD-10-CM | POA: Diagnosis not present

## 2017-04-06 DIAGNOSIS — J45909 Unspecified asthma, uncomplicated: Secondary | ICD-10-CM | POA: Insufficient documentation

## 2017-04-06 DIAGNOSIS — Z87891 Personal history of nicotine dependence: Secondary | ICD-10-CM | POA: Diagnosis not present

## 2017-04-06 DIAGNOSIS — J441 Chronic obstructive pulmonary disease with (acute) exacerbation: Secondary | ICD-10-CM | POA: Insufficient documentation

## 2017-04-06 DIAGNOSIS — R0602 Shortness of breath: Secondary | ICD-10-CM | POA: Diagnosis present

## 2017-04-06 MED ORDER — ALBUTEROL SULFATE HFA 108 (90 BASE) MCG/ACT IN AERS: 2.0000 | INHALATION_SPRAY | RESPIRATORY_TRACT | 1 refills | Status: AC | PRN

## 2017-04-06 MED ORDER — FLUTICASONE-SALMETEROL 250-50 MCG/DOSE IN AEPB: 1.0000 | INHALATION_SPRAY | Freq: Two times a day (BID) | RESPIRATORY_TRACT | 1 refills | Status: AC

## 2017-04-06 MED ORDER — PREDNISONE 20 MG PO TABS: ORAL_TABLET | ORAL | 0 refills | Status: DC

## 2017-04-06 MED ORDER — ALBUTEROL SULFATE HFA 108 (90 BASE) MCG/ACT IN AERS
2.0000 | INHALATION_SPRAY | Freq: Once | RESPIRATORY_TRACT | Status: AC
  Administered 2017-04-06: 2 via RESPIRATORY_TRACT
  Filled 2017-04-06: qty 6.7

## 2017-04-06 MED ORDER — PREDNISONE 20 MG PO TABS
60.0000 mg | ORAL_TABLET | Freq: Once | ORAL | Status: AC
  Administered 2017-04-06: 60 mg via ORAL
  Filled 2017-04-06: qty 3

## 2017-04-06 MED ORDER — ALBUTEROL SULFATE (2.5 MG/3ML) 0.083% IN NEBU
5.0000 mg | INHALATION_SOLUTION | Freq: Once | RESPIRATORY_TRACT | Status: AC
  Administered 2017-04-06: 5 mg via RESPIRATORY_TRACT
  Filled 2017-04-06: qty 6

## 2017-04-06 NOTE — ED Triage Notes (Signed)
Reports increased sob since Friday.  Ran out of albuterol for about a month.  Using advair at home.  Lungs noted to be coarse with wheezing throughout all fields.  Dyspneic with exertion.

## 2017-04-06 NOTE — ED Provider Notes (Signed)
[facility] Provider Note   CSN: [id number] Arrival date & time: 04/06/17  [id number]     History   Chief Complaint Chief Complaint  Patient presents with  . Shortness of Breath    HPI Dylan Duncan A Consolo is a 62 y.o. male.  HPI  62 year old male presents with shortness of breath.  Started 3 days ago.  Feels like prior COPD flares.  He has been using his Advair but now realized he is out.  He does not have a rescue inhaler.  He has not had a significant fever that he knows of.  He had a little bit of cough with occasional clear sputum.  No chest pain but he has had a headache and abdominal pain that seems to come with cough.  Currently receiving an albuterol treatment and states he has noticed some improvement.  Denies any leg swelling. No rhinorrhea/congestion.  Past Medical History:  Diagnosis Date  . Acid reflux   . Asthma   . COPD (chronic obstructive pulmonary disease) (HCC)    ?Uses Adviar?  . Emphysema of lung (HCC)   . GERD (gastroesophageal reflux disease)   . Hepatic steatosis   . 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 Liver fibrosis    fibrotest F4,elastography F2-F3  . Post traumatic stress disorder (PTSD)     Patient Active Problem List   Diagnosis Date Noted  . Asymptomatic gallstones 08/04/2012  . Steatohepatitis, HCV & EtOH 08/04/2012  . History of ETOH abuse 08/04/2012  . Portal hypertensive gastropathy (HCC) 06/27/2012  . Hematemesis 06/26/2012  . Hepatitis C   . Asthma     Past Surgical History:  Procedure Laterality Date  . arm surgery Right   . ESOPHAGOGASTRODUODENOSCOPY N/A 06/27/2012   Procedure: ESOPHAGOGASTRODUODENOSCOPY (EGD);  Surgeon: Petra KubaMarc E Magod, MD;  Location: [facility] ENDOSCOPY;  Service: Endoscopy;  Laterality: N/A;  . MANDIBLE SURGERY         Home Medications    Prior to Admission medications   Medication Sig Start Date End Date Taking? Authorizing  Provider  albuterol (PROVENTIL HFA;VENTOLIN HFA) 108 (90 Base) MCG/ACT inhaler Inhale 2 puffs into the lungs every 4 (four) hours as needed for wheezing or shortness of breath. 04/06/17   Pricilla LovelessGoldston, Raphael Espe, MD  cetirizine (ZYRTEC) 10 MG tablet Take 10 mg by mouth daily.    [provider]  Fluticasone-Salmeterol (ADVAIR DISKUS) 250-50 MCG/DOSE AEPB Inhale 1 puff into the lungs every 12 (twelve) hours. 04/06/17   Pricilla LovelessGoldston, Shantil Vallejo, MD  PEG-KCl-NaCl-NaSulf-Na Asc-C (PLENVU) 140 g SOLR Take 140 g by mouth as directed. 03/24/17   Sherrilyn Ristanis, Henry L III, MD  predniSONE (DELTASONE) 20 MG tablet 2 tabs po daily x 4 days 04/07/17   Pricilla LovelessGoldston, Capitola Ladson, MD  ranitidine (ZANTAC) 75 MG tablet Take 75 mg by mouth daily.    [provider]    Family History Family History  Problem Relation Age of Onset  . Pneumonia Mother   . Heart disease Father        Hx of MI    Social History Social History   Tobacco Use  . Smoking status: Former Smoker    Packs/day: 0.14    Years: 4.00    Pack years: 0.56    Types: Cigarettes    Last attempt to quit: 03/17/2010    Years since quitting: 7.0  . Smokeless tobacco: Never Used  Substance Use Topics  .  Alcohol use: Yes    Alcohol/week: 1.2 oz    Types: 2 Cans of beer per week    Comment: Daily.  . Drug use: Yes    Frequency: 2.0 times per week    Types: Marijuana     Allergies   Lactose intolerance (gi) and Penicillins   Review of Systems Review of Systems  Constitutional: Negative for fever.  Respiratory: Positive for cough and shortness of breath.   Cardiovascular: Negative for chest pain and leg swelling.  Gastrointestinal: Positive for abdominal pain. Negative for vomiting.  Neurological: Positive for headaches.  All other systems reviewed and are negative.    Physical Exam Updated Vital Signs BP 117/75 (BP Location: Right Arm)   Pulse (!) 103   Temp 98.5 F (36.9 C) (Oral)   Resp (!) 22   Ht 6\' 1"  (1.854 m)   Wt 59 kg (130 lb)    SpO2 100%   BMI 17.15 kg/m   Physical Exam  Constitutional: He is oriented to person, place, and time. He appears well-developed and well-nourished.  Non-toxic appearance. He does not appear ill. No distress.  HENT:  Head: Normocephalic and atraumatic.  Right Ear: External ear normal.  Left Ear: External ear normal.  Nose: Nose normal.  Eyes: Right eye exhibits no discharge. Left eye exhibits no discharge.  Neck: Neck supple.  Cardiovascular: Normal rate, regular rhythm and normal heart sounds.  HR ~100  Pulmonary/Chest: Effort normal. No accessory muscle usage. Tachypnea noted. No respiratory distress. He has wheezes (mild, expiratory).  Abdominal: Soft. There is no tenderness.  Musculoskeletal: He exhibits no edema.       Right lower leg: He exhibits no edema.       Left lower leg: He exhibits no edema.  Neurological: He is alert and oriented to person, place, and time.  Skin: Skin is warm and dry.  Nursing note and vitals reviewed.    ED Treatments / Results  Labs (all labs ordered are listed, but only abnormal results are displayed) Labs Reviewed - No data to display  EKG  EKG Interpretation  Date/Time:  Monday April 06 2017 07:03:49 EST Ventricular Rate:  102 PR Interval:  126 QRS Duration: 88 QT Interval:  332 QTC Calculation: 432 R Axis:   98 Text Interpretation:  Sinus tachycardia Right atrial enlargement Rightward axis Anterior infarct , age undetermined Abnormal ECG rate faster compared to 2014 Confirmed by Pricilla Loveless [phone]) on 04/06/2017 7:18:56 AM       Radiology Dg Chest 2 View  Result Date: 04/06/2017 CLINICAL DATA:  Shortness of breath, headache, and productive cough. Smoking history. EXAM: CHEST  2 VIEW COMPARISON:  10/10/2012 FINDINGS: The cardiomediastinal silhouette is unchanged and within normal limits. The lungs remain mildly hyperinflated with evidence of underlying emphysema. Biapical scarring is similar to the prior study. There is no  evidence of acute airspace consolidation, edema, sizable pleural effusion, or pneumothorax. No acute osseous abnormality is seen. IMPRESSION: COPD without evidence of active cardiopulmonary disease. Electronically Signed   By: Sebastian Ache M.D.   On: 04/06/2017 07:57    Procedures Procedures (including critical care time)  Medications Ordered in ED Medications  albuterol (PROVENTIL) (2.5 MG/3ML) 0.083% nebulizer solution 5 mg (5 mg Nebulization Given 04/06/17 0711)  predniSONE (DELTASONE) tablet 60 mg (60 mg Oral Given 04/06/17 0813)  albuterol (PROVENTIL HFA;VENTOLIN HFA) 108 (90 Base) MCG/ACT inhaler 2 puff (2 puffs Inhalation Given 04/06/17 0813)     Initial Impression / Assessment and Plan /  ED Course  I have reviewed the triage vital signs and the nursing notes.  Pertinent labs & imaging results that were available during my care of the patient were reviewed by me and considered in my medical decision making (see chart for details).     Patient presents with a COPD exacerbation.  Overall he has mild tachypnea but no respiratory distress.  He feels better after albuterol.  When offered more because he still looks a little tachypneic, he declines and wants to go home.  He is not hypoxic.  His mild tachycardia is likely from the dyspnea/COPD.  My suspicion for PE is low.  Doubt ACS or dissection.  There is no chest pain.  I will restart him on Advair as well as give him an inhaler and do a steroid burst.  Discussed need for follow-up with PCP and return precautions.  Final Clinical Impressions(s) / ED Diagnoses   Final diagnoses:  COPD exacerbation [facility])    ED Discharge Orders        Ordered    predniSONE (DELTASONE) 20 MG tablet     04/06/17 0751    Fluticasone-Salmeterol (ADVAIR DISKUS) 250-50 MCG/DOSE AEPB  Every 12 hours     04/06/17 0751    albuterol (PROVENTIL HFA;VENTOLIN HFA) 108 (90 Base) MCG/ACT inhaler  Every 4 hours PRN     04/06/17 0751       Pricilla Loveless,  MD 04/06/17 [phone]

## 2017-04-06 NOTE — ED Notes (Signed)
Patient transported to X-ray 

## 2017-04-17 ENCOUNTER — Encounter: Payer: Medicaid Other | Admitting: Gastroenterology

## 2017-06-02 ENCOUNTER — Ambulatory Visit (AMBULATORY_SURGERY_CENTER): Payer: Self-pay

## 2017-06-02 ENCOUNTER — Other Ambulatory Visit: Payer: Self-pay

## 2017-06-02 VITALS — Ht 74.0 in | Wt 130.6 lb

## 2017-06-02 DIAGNOSIS — K625 Hemorrhage of anus and rectum: Secondary | ICD-10-CM

## 2017-06-02 MED ORDER — PLENVU 140 G PO SOLR: 1.0000 | Freq: Once | ORAL | 0 refills | Status: AC

## 2017-06-02 NOTE — Progress Notes (Signed)
Denies allergies to eggs or soy products. Denies complication of anesthesia or sedation. Denies use of weight loss medication. Denies use of O2.   [name] instructions declined.    Patient already has a sample of Plenvu.   Patient was given extra time to review prep instructions. Dylan Duncan has several challenges. He does not drive and has trouble finding a care partner. Challenge number two is he does not read and has trouble retaining what was just said. Patient seems to be frustrated and overwhelmed with instructions and the procedure process. Patient did verbalize some of the instructions. I tried to get him to remember the most important aspects of prepping. I asked if he has someone at home that can read and review his instructions with him. He said that he did not. Every attempt was made to encourage the patient to follow through with his procedure.

## 2017-06-08 ENCOUNTER — Other Ambulatory Visit: Payer: Self-pay | Admitting: Nurse Practitioner

## 2017-06-08 DIAGNOSIS — K7400 Hepatic fibrosis, unspecified: Secondary | ICD-10-CM

## 2017-06-11 ENCOUNTER — Telehealth: Payer: Self-pay | Admitting: Gastroenterology

## 2017-06-11 NOTE — Telephone Encounter (Signed)
[facility], he does not have anyone that is able to stay here with him. Patient asked to have the procedure cancelled. He is wondering if it could be done at the hospital instead. Looking at your schedule for 4/9, you are completely booked. Please advise.

## 2017-06-15 NOTE — Telephone Encounter (Signed)
I spoke with Mr. Dylan Duncan.  He agreed to contact [employer] and see what services are offered and the price. He is provided the phone number of [phone].  He understands to call back once he has spoken with them if he wishes to proceed with the procedure in the [facility].  He verbalized understanding.

## 2017-06-15 NOTE — Telephone Encounter (Signed)
Unfortunately, I cannot justify a hospital admission for procedures due to a ride issue.  Please give him the information on [employer] or similar service for care partner services.  If he decides to do that, then the procedures can be re-scheduled.

## 2017-06-16 ENCOUNTER — Encounter: Payer: Medicaid Other | Admitting: Gastroenterology

## 2017-07-14 ENCOUNTER — Other Ambulatory Visit: Payer: Medicaid Other

## 2018-02-26 ENCOUNTER — Encounter (HOSPITAL_COMMUNITY): Payer: Self-pay

## 2018-02-26 ENCOUNTER — Other Ambulatory Visit: Payer: Self-pay

## 2018-02-26 ENCOUNTER — Emergency Department (HOSPITAL_COMMUNITY)
Admission: EM | Admit: 2018-02-26 | Discharge: 2018-02-26 | Disposition: A | Discharge status: Home / Self Care | Payer: Medicaid Other | Attending: Emergency Medicine | Admitting: Emergency Medicine

## 2018-02-26 DIAGNOSIS — R3911 Hesitancy of micturition: Secondary | ICD-10-CM

## 2018-02-26 DIAGNOSIS — F1290 Cannabis use, unspecified, uncomplicated: Secondary | ICD-10-CM | POA: Diagnosis not present

## 2018-02-26 DIAGNOSIS — Z87891 Personal history of nicotine dependence: Secondary | ICD-10-CM | POA: Diagnosis not present

## 2018-02-26 DIAGNOSIS — R972 Elevated prostate specific antigen [PSA]: Secondary | ICD-10-CM | POA: Diagnosis not present

## 2018-02-26 DIAGNOSIS — N390 Urinary tract infection, site not specified: Secondary | ICD-10-CM | POA: Diagnosis not present

## 2018-02-26 DIAGNOSIS — B1710 Acute hepatitis C without hepatic coma: Secondary | ICD-10-CM | POA: Diagnosis not present

## 2018-02-26 DIAGNOSIS — J449 Chronic obstructive pulmonary disease, unspecified: Secondary | ICD-10-CM | POA: Diagnosis not present

## 2018-02-26 LAB — URINALYSIS, ROUTINE W REFLEX MICROSCOPIC
Bilirubin Urine: NEGATIVE
GLUCOSE, UA: NEGATIVE mg/dL
KETONES UR: NEGATIVE mg/dL
Nitrite: NEGATIVE
PH: 5 (ref 5.0–8.0)
Protein, ur: NEGATIVE mg/dL
Specific Gravity, Urine: 1.016 (ref 1.005–1.030)

## 2018-02-26 MED ORDER — CEPHALEXIN 500 MG PO CAPS: 500.0000 mg | ORAL_CAPSULE | Freq: Two times a day (BID) | ORAL | 0 refills | Status: DC

## 2018-02-26 NOTE — ED Provider Notes (Signed)
[facility] EMERGENCY DEPARTMENT Provider Note   CSN: [id number] Arrival date & time: 02/26/18  0715     History   Chief Complaint Chief Complaint  Patient presents with  . Urinary Retention    HPI Dylan Duncan is a 62 y.o. male who presents with urinary hesitancy. PMH significant for COPD, GERD, Hep C, hx of substance abuse. He states that for the past 5 days he has difficulty urinating and emptying his bladder. He denies dysuria, frequency, hematuria, fever, flank pain, N/V. No testicular pain or swelling. He has some pain over his bladder. He states that he has been told something is wrong with his prostate because he went to his doctor and they did blood tests and told he needs to see a urologist but they never got back to him. On review of labs results he gave me from 12/25/17 his SCr was 0.92, and PSA was 6.2.   HPI  Past Medical History:  Diagnosis Date  . Acid reflux   . Allergy   . Asthma   . Cataract   . COPD (chronic obstructive pulmonary disease) (HCC)    ?Uses Adviar?  . Emphysema of lung (HCC)   . GERD (gastroesophageal reflux disease)   . Hepatic steatosis   . 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 Liver fibrosis    fibrotest F4,elastography F2-F3  . Post traumatic stress disorder (PTSD)   . Substance abuse [facility])     Patient Active Problem List   Diagnosis Date Noted  . Asymptomatic gallstones 08/04/2012  . Steatohepatitis, HCV & EtOH 08/04/2012  . History of ETOH abuse 08/04/2012  . Portal hypertensive gastropathy (HCC) 06/27/2012  . Hematemesis 06/26/2012  . Hepatitis C   . Asthma     Past Surgical History:  Procedure Laterality Date  . arm surgery Right   . ESOPHAGOGASTRODUODENOSCOPY N/A 06/27/2012   Procedure: ESOPHAGOGASTRODUODENOSCOPY (EGD);  Surgeon: [name], MD;  Location: [facility] ENDOSCOPY;  Service: Endoscopy;  Laterality: N/A;  . MANDIBLE SURGERY          Home  Medications    Prior to Admission medications   Medication Sig Start Date End Date Taking? Authorizing Provider  albuterol (PROVENTIL HFA;VENTOLIN HFA) 108 (90 Base) MCG/ACT inhaler Inhale 2 puffs into the lungs every 4 (four) hours as needed for wheezing or shortness of breath. 04/06/17   Pricilla Loveless, MD  cetirizine (ZYRTEC) 10 MG tablet Take 10 mg by mouth daily.    [provider]  Fluticasone-Salmeterol (ADVAIR DISKUS) 250-50 MCG/DOSE AEPB Inhale 1 puff into the lungs every 12 (twelve) hours. 04/06/17   Pricilla Loveless, MD  PEG-KCl-NaCl-NaSulf-Na Asc-C (PLENVU) 140 g SOLR Take 140 g by mouth as directed. 03/24/17   Sherrilyn Rist, MD  ranitidine (ZANTAC) 75 MG tablet Take 75 mg by mouth daily.    [provider]    Family History Family History  Problem Relation Age of Onset  . Pneumonia Mother   . Heart disease Father        Hx of MI  . Colon cancer Neg Hx   . Esophageal cancer Neg Hx   . Liver cancer Neg Hx   . Pancreatic cancer Neg Hx   . Rectal cancer Neg Hx   . Stomach cancer Neg Hx     Social History Social History   Tobacco Use  . Smoking status: Former Smoker  Packs/day: 0.14    Years: 4.00    Pack years: 0.56    Types: Cigarettes    Last attempt to quit: 03/17/2010    Years since quitting: 7.9  . Smokeless tobacco: Never Used  Substance Use Topics  . Alcohol use: Yes    Alcohol/week: 2.0 standard drinks    Types: 2 Cans of beer per week    Comment: Daily.  . Drug use: Yes    Frequency: 2.0 times per week    Types: Marijuana     Allergies   Lactose intolerance (gi) and Penicillins   Review of Systems Review of Systems  Constitutional: Negative for fever.  Respiratory: Negative for shortness of breath.   Cardiovascular: Negative for chest pain.  Gastrointestinal: Negative for abdominal pain, nausea and vomiting.  Genitourinary: Positive for difficulty urinating. Negative for dysuria, flank pain, frequency, hematuria, penile  pain and testicular pain.  All other systems reviewed and are negative.    Physical Exam Updated Vital Signs BP 108/81 (BP Location: Right Arm)   Pulse 75   Temp 98.3 F (36.8 C) (Oral)   Resp 18   Ht 6\' 1"  (1.854 m)   Wt 61.2 kg   SpO2 100%   BMI 17.81 kg/m   Physical Exam Vitals signs and nursing note reviewed.  Constitutional:      General: He is not in acute distress.    Appearance: Normal appearance. He is well-developed. He is not ill-appearing.     Comments: Thin male in NAD  HENT:     Head: Normocephalic and atraumatic.  Eyes:     General: No scleral icterus.       Right eye: No discharge.        Left eye: No discharge.     Conjunctiva/sclera: Conjunctivae normal.     Pupils: Pupils are equal, round, and reactive to light.  Neck:     Musculoskeletal: Normal range of motion.  Cardiovascular:     Rate and Rhythm: Normal rate.  Pulmonary:     Effort: Pulmonary effort is normal. No respiratory distress.     Breath sounds: Normal breath sounds.  Abdominal:     General: Abdomen is flat. There is no distension.     Tenderness: There is no abdominal tenderness.  Genitourinary:    Comments: Bladder scan: 90cc  No inguinal lymphadenopathy or inguinal hernia noted. Normal circumcised penis free of lesions or rash. Testicles are nontender with normal lie. Normal scrotal appearance. No obvious discharge noted. Chaperone present during exam.  Skin:    General: Skin is warm and dry.  Neurological:     Mental Status: He is alert and oriented to person, place, and time.  Psychiatric:        Behavior: Behavior normal.      ED Treatments / Results  Labs (all labs ordered are listed, but only abnormal results are displayed) Labs Reviewed  URINALYSIS, ROUTINE W REFLEX MICROSCOPIC - Abnormal; Notable for the following components:      Result Value   APPearance CLOUDY (*)    Hgb urine dipstick SMALL (*)    Leukocytes, UA LARGE (*)    WBC, UA >50 (*)    Bacteria, UA  MANY (*)    All other components within normal limits  URINE CULTURE    EKG None  Radiology No results found.  Procedures Procedures (including critical care time)  Medications Ordered in ED Medications - No data to display   Initial Impression / Assessment and Plan /  ED Course  I have reviewed the triage vital signs and the nursing notes.  Pertinent labs & imaging results that were available during my care of the patient were reviewed by me and considered in my medical decision making (see chart for details).  62 year old male presents with urinary hesitancy for the past couple days. He is well appearing. Vitals are normal. Exam is unremarkable. UA appears consistent with UTI. Will send culture and treat with Keflex. He was given referral to Urology to f/u on his elevated PSA. He verbalized understanding.  Final Clinical Impressions(s) / ED Diagnoses   Final diagnoses:  Urinary hesitancy  Elevated PSA  Lower urinary tract infectious disease    ED Discharge Orders    None       Bethel BornGekas, Dmiya Malphrus Marie, PA-C 02/26/18 1131    Donnetta Hutchingook, Brian, MD 03/02/18 [phone]

## 2018-02-26 NOTE — ED Triage Notes (Addendum)
Pt c/o urinary hesitancy x 5 days ; pt states " I pee a little bit and then it will stop" pt denies any dysuria ; pt also states he might have some " prostate issues "

## 2018-02-26 NOTE — Discharge Instructions (Signed)
Take antibiotic twice a day for 5 days Please follow up with urology because your PSA test was high Return if worsening

## 2018-02-27 LAB — URINE CULTURE: Culture: >=100000 — AB

## 2018-02-28 ENCOUNTER — Telehealth: Payer: Self-pay

## 2018-02-28 NOTE — Telephone Encounter (Signed)
Post ED Visit - Positive Culture Follow-up  Culture report reviewed by antimicrobial stewardship pharmacist:  []  Enzo BiNathan Batchelder, Pharm.D. []  Celedonio MiyamotoJeremy Frens, Pharm.D., BCPS AQ-ID []  Garvin FilaMike Maccia, Pharm.D., BCPS []  Georgina PillionElizabeth Martin, Pharm.D., BCPS []  SutersvilleMinh Pham, [address] Rainbow BoulevardPharm.D., BCPS, AAHIVP []  Estella HuskMichelle Turner, Pharm.D., BCPS, AAHIVP []  Lysle Pearlachel Rumbarger, PharmD, BCPS []  Phillips Climeshuy Dang, PharmD, BCPS []  Agapito GamesAlison Masters, PharmD, BCPS [x]  Verlan FriendsErin Deja, PharmD  Positive urine culture Treated with Cephalexin, organism sensitive to the same and no further patient follow-up is required at this time.  Jerry CarasCullom, Demetria Lightsey Burnett 02/28/2018, 9:27 AM

## 2018-06-14 ENCOUNTER — Encounter (HOSPITAL_COMMUNITY): Payer: Self-pay

## 2018-06-14 ENCOUNTER — Emergency Department (HOSPITAL_COMMUNITY): Payer: Medicaid Other

## 2018-06-14 ENCOUNTER — Other Ambulatory Visit: Payer: Self-pay

## 2018-06-14 ENCOUNTER — Emergency Department (HOSPITAL_COMMUNITY)
Admission: EM | Admit: 2018-06-14 | Discharge: 2018-06-14 | Disposition: A | Discharge status: Home / Self Care | Payer: Medicaid Other | Attending: Emergency Medicine | Admitting: Emergency Medicine

## 2018-06-14 DIAGNOSIS — Z79899 Other long term (current) drug therapy: Secondary | ICD-10-CM | POA: Diagnosis not present

## 2018-06-14 DIAGNOSIS — J449 Chronic obstructive pulmonary disease, unspecified: Secondary | ICD-10-CM | POA: Insufficient documentation

## 2018-06-14 DIAGNOSIS — Y939 Activity, unspecified: Secondary | ICD-10-CM | POA: Insufficient documentation

## 2018-06-14 DIAGNOSIS — Y999 Unspecified external cause status: Secondary | ICD-10-CM | POA: Insufficient documentation

## 2018-06-14 DIAGNOSIS — S53401A Unspecified sprain of right elbow, initial encounter: Secondary | ICD-10-CM | POA: Insufficient documentation

## 2018-06-14 DIAGNOSIS — Y92410 Unspecified street and highway as the place of occurrence of the external cause: Secondary | ICD-10-CM | POA: Insufficient documentation

## 2018-06-14 DIAGNOSIS — Z87891 Personal history of nicotine dependence: Secondary | ICD-10-CM | POA: Diagnosis not present

## 2018-06-14 DIAGNOSIS — S46911A Strain of unspecified muscle, fascia and tendon at shoulder and upper arm level, right arm, initial encounter: Secondary | ICD-10-CM

## 2018-06-14 DIAGNOSIS — S59901A Unspecified injury of right elbow, initial encounter: Secondary | ICD-10-CM | POA: Diagnosis present

## 2018-06-14 MED ORDER — ACETAMINOPHEN 500 MG PO TABS
1000.0000 mg | ORAL_TABLET | Freq: Once | ORAL | Status: AC
  Administered 2018-06-14: 1000 mg via ORAL
  Filled 2018-06-14: qty 2

## 2018-06-14 MED ORDER — IBUPROFEN 800 MG PO TABS
800.0000 mg | ORAL_TABLET | Freq: Once | ORAL | Status: AC
  Administered 2018-06-14: 800 mg via ORAL
  Filled 2018-06-14: qty 1

## 2018-06-14 NOTE — Discharge Instructions (Signed)
Your xray shows no acute bony injury.  Follow up with your ortho or your PCP.     Take 4 over the counter ibuprofen tablets 3 times a day or 2 over-the-counter naproxen tablets twice a day for pain. Also take tylenol 1000mg (2 extra strength) four times a day.
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
## 2018-06-14 NOTE — ED Notes (Signed)
Discharge instructions discussed with Pt. Pt verbalized understanding. Pt stable and ambulatory.    

## 2018-08-16 ENCOUNTER — Emergency Department (HOSPITAL_COMMUNITY)
Admission: EM | Admit: 2018-08-16 | Discharge: 2018-08-16 | Disposition: A | Discharge status: Home / Self Care | Payer: Medicaid Other | Attending: Emergency Medicine | Admitting: Emergency Medicine

## 2018-08-16 ENCOUNTER — Emergency Department (HOSPITAL_COMMUNITY): Payer: Medicaid Other

## 2018-08-16 ENCOUNTER — Encounter (HOSPITAL_COMMUNITY): Payer: Self-pay | Admitting: *Deleted

## 2018-08-16 ENCOUNTER — Other Ambulatory Visit: Payer: Self-pay

## 2018-08-16 DIAGNOSIS — W2209XA Striking against other stationary object, initial encounter: Secondary | ICD-10-CM | POA: Diagnosis not present

## 2018-08-16 DIAGNOSIS — M79661 Pain in right lower leg: Secondary | ICD-10-CM | POA: Diagnosis not present

## 2018-08-16 DIAGNOSIS — J449 Chronic obstructive pulmonary disease, unspecified: Secondary | ICD-10-CM | POA: Diagnosis not present

## 2018-08-16 DIAGNOSIS — F1290 Cannabis use, unspecified, uncomplicated: Secondary | ICD-10-CM | POA: Diagnosis not present

## 2018-08-16 DIAGNOSIS — Y939 Activity, unspecified: Secondary | ICD-10-CM | POA: Diagnosis not present

## 2018-08-16 DIAGNOSIS — S8991XA Unspecified injury of right lower leg, initial encounter: Secondary | ICD-10-CM | POA: Diagnosis present

## 2018-08-16 DIAGNOSIS — Z79899 Other long term (current) drug therapy: Secondary | ICD-10-CM | POA: Diagnosis not present

## 2018-08-16 DIAGNOSIS — Y929 Unspecified place or not applicable: Secondary | ICD-10-CM | POA: Diagnosis not present

## 2018-08-16 DIAGNOSIS — M79604 Pain in right leg: Secondary | ICD-10-CM

## 2018-08-16 DIAGNOSIS — Y999 Unspecified external cause status: Secondary | ICD-10-CM | POA: Diagnosis not present

## 2018-08-16 DIAGNOSIS — Z87891 Personal history of nicotine dependence: Secondary | ICD-10-CM | POA: Insufficient documentation
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
## 2018-08-16 NOTE — Discharge Instructions (Signed)
Please read instructions below. Apply ice to your leg for 20 minutes at a time. Elevate it as much as possible to help with swelling. You can take over the counter medications as needed for pain. Schedule an appointment with the sports medicine specialist in 2 weeks if symptoms do not improve.  Return to the ER for new or concerning symptoms.
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
## 2018-08-16 NOTE — ED Notes (Signed)
ED Provider at bedside. 

## 2018-08-16 NOTE — ED Notes (Signed)
Patient transported to X-ray 

## 2018-08-16 NOTE — Progress Notes (Signed)
Orthopedic Tech Progress Note Patient Details:  Dylan Duncan 10/11/1955 [id number] Applied a knee sleeve to the right knee. But every time I click on knee sleeve I will not allow me to choose it. Ortho Devices Type of Ortho Device: Knee Sleeve Ortho Device/Splint Location: LRE Ortho Device/Splint Interventions: Adjustment, Application, Ordered   Post Interventions Patient Tolerated: Well Instructions Provided: Care of device, Adjustment of device   Donald Pore 08/16/2018, 4:16 PM

## 2018-08-18 ENCOUNTER — Other Ambulatory Visit: Payer: Self-pay

## 2018-08-18 ENCOUNTER — Encounter: Payer: Self-pay | Admitting: Family Medicine

## 2018-08-18 ENCOUNTER — Ambulatory Visit (INDEPENDENT_AMBULATORY_CARE_PROVIDER_SITE_OTHER): Payer: Medicaid Other | Admitting: Family Medicine

## 2018-08-18 VITALS — BP 110/60 | Ht 73.0 in | Wt 135.0 lb

## 2018-08-18 DIAGNOSIS — S8391XA Sprain of unspecified site of right knee, initial encounter: Secondary | ICD-10-CM

## 2018-08-18 NOTE — Patient Instructions (Addendum)
You sprained your PCL but this is not torn. Wear the knee brace when up and walking around. Work on easy motion exercises, knee extensions, straight leg raises 3 sets of 10 once a day. Add ankle weight if these become too easy. Expect this to resolve over 2-4 weeks at which case you can stop wearing the brace. Icing 15 minutes at a time 3-4 times a day as needed. Ibuprofen 600mg  three times a day with food OR aleve 2 tabs twice a day with food for pain and inflammation rarely if needed. Topical voltaren gel, aspercreme or biofreeze up to 4 times a day is a safer option to use though for pain. Follow up with me in 3-4 weeks or as needed if you're doing well.
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
## 2018-08-18 NOTE — Assessment & Plan Note (Signed)
Due to absence of an effusion of the right knee and normal ligament testing, patient does not have a tear of his PCL or other ligament.  Patient was reassured about this and told that he likely has a sprain of his knee that will heal with time.  He was supplied with a right knee brace to give him more support while his sprain heals.

## 2018-08-19 ENCOUNTER — Encounter: Payer: Self-pay | Admitting: Family Medicine

## 2018-09-08 ENCOUNTER — Ambulatory Visit: Payer: Medicaid Other | Admitting: Family Medicine

## 2019-01-18 ENCOUNTER — Encounter (HOSPITAL_COMMUNITY): Payer: Self-pay

## 2019-01-18 ENCOUNTER — Emergency Department (HOSPITAL_COMMUNITY): Payer: Medicaid Other

## 2019-01-18 ENCOUNTER — Inpatient Hospital Stay (HOSPITAL_COMMUNITY)
Admission: EM | Admit: 2019-01-18 | Discharge: 2019-01-21 | DRG: 378 | Discharge status: Against Medical Advice | Payer: Medicaid Other | Attending: Internal Medicine | Admitting: Internal Medicine

## 2019-01-18 ENCOUNTER — Other Ambulatory Visit: Payer: Self-pay

## 2019-01-18 DIAGNOSIS — Z880 Allergy status to penicillin: Secondary | ICD-10-CM

## 2019-01-18 DIAGNOSIS — D649 Anemia, unspecified: Secondary | ICD-10-CM | POA: Diagnosis present

## 2019-01-18 DIAGNOSIS — F1290 Cannabis use, unspecified, uncomplicated: Secondary | ICD-10-CM | POA: Diagnosis present

## 2019-01-18 DIAGNOSIS — Z87891 Personal history of nicotine dependence: Secondary | ICD-10-CM

## 2019-01-18 DIAGNOSIS — K3189 Other diseases of stomach and duodenum: Secondary | ICD-10-CM | POA: Diagnosis not present

## 2019-01-18 DIAGNOSIS — K2980 Duodenitis without bleeding: Secondary | ICD-10-CM | POA: Diagnosis present

## 2019-01-18 DIAGNOSIS — K922 Gastrointestinal hemorrhage, unspecified: Secondary | ICD-10-CM | POA: Diagnosis not present

## 2019-01-18 DIAGNOSIS — Z79891 Long term (current) use of opiate analgesic: Secondary | ICD-10-CM | POA: Diagnosis not present

## 2019-01-18 DIAGNOSIS — D62 Acute posthemorrhagic anemia: Secondary | ICD-10-CM | POA: Diagnosis present

## 2019-01-18 DIAGNOSIS — B182 Chronic viral hepatitis C: Secondary | ICD-10-CM | POA: Diagnosis present

## 2019-01-18 DIAGNOSIS — K219 Gastro-esophageal reflux disease without esophagitis: Secondary | ICD-10-CM | POA: Diagnosis present

## 2019-01-18 DIAGNOSIS — K921 Melena: Secondary | ICD-10-CM | POA: Diagnosis present

## 2019-01-18 DIAGNOSIS — J449 Chronic obstructive pulmonary disease, unspecified: Secondary | ICD-10-CM | POA: Diagnosis present

## 2019-01-18 DIAGNOSIS — Z681 Body mass index (BMI) 19 or less, adult: Secondary | ICD-10-CM | POA: Diagnosis not present

## 2019-01-18 DIAGNOSIS — K635 Polyp of colon: Secondary | ICD-10-CM | POA: Diagnosis not present

## 2019-01-18 DIAGNOSIS — E440 Moderate protein-calorie malnutrition: Secondary | ICD-10-CM | POA: Diagnosis present

## 2019-01-18 DIAGNOSIS — K2970 Gastritis, unspecified, without bleeding: Secondary | ICD-10-CM | POA: Diagnosis present

## 2019-01-18 DIAGNOSIS — F1010 Alcohol abuse, uncomplicated: Secondary | ICD-10-CM | POA: Diagnosis present

## 2019-01-18 DIAGNOSIS — K449 Diaphragmatic hernia without obstruction or gangrene: Secondary | ICD-10-CM | POA: Diagnosis present

## 2019-01-18 DIAGNOSIS — Z79899 Other long term (current) drug therapy: Secondary | ICD-10-CM | POA: Diagnosis not present

## 2019-01-18 DIAGNOSIS — K317 Polyp of stomach and duodenum: Secondary | ICD-10-CM | POA: Diagnosis not present

## 2019-01-18 DIAGNOSIS — E739 Lactose intolerance, unspecified: Secondary | ICD-10-CM | POA: Diagnosis present

## 2019-01-18 DIAGNOSIS — F4310 Post-traumatic stress disorder, unspecified: Secondary | ICD-10-CM | POA: Diagnosis present

## 2019-01-18 DIAGNOSIS — Z20828 Contact with and (suspected) exposure to other viral communicable diseases: Secondary | ICD-10-CM | POA: Diagnosis present

## 2019-01-18 DIAGNOSIS — D500 Iron deficiency anemia secondary to blood loss (chronic): Secondary | ICD-10-CM | POA: Diagnosis present

## 2019-01-18 LAB — I-STAT CHEM 8, ED
BUN: 10 mg/dL (ref 8–23)
Calcium, Ion: 1.14 mmol/L — ABNORMAL LOW (ref 1.15–1.40)
Chloride: 102 mmol/L (ref 98–111)
Creatinine, Ser: 0.9 mg/dL (ref 0.61–1.24)
Glucose, Bld: 92 mg/dL (ref 70–99)
HCT: 15 % — ABNORMAL LOW (ref 39.0–52.0)
Hemoglobin: 5.1 g/dL — CL (ref 13.0–17.0)
Potassium: 3.9 mmol/L (ref 3.5–5.1)
Sodium: 136 mmol/L (ref 135–145)
TCO2: 22 mmol/L (ref 22–32)

## 2019-01-18 LAB — PROTIME-INR
INR: 1.2 (ref 0.8–1.2)
Prothrombin Time: 15 seconds (ref 11.4–15.2)

## 2019-01-18 LAB — FERRITIN: Ferritin: 4 ng/mL — ABNORMAL LOW (ref 24–336)

## 2019-01-18 LAB — CBC
HCT: 13.3 % — ABNORMAL LOW (ref 39.0–52.0)
Hemoglobin: 3.5 g/dL — CL (ref 13.0–17.0)
MCH: 16.8 pg — ABNORMAL LOW (ref 26.0–34.0)
MCHC: 26.3 g/dL — ABNORMAL LOW (ref 30.0–36.0)
MCV: 63.9 fL — ABNORMAL LOW (ref 80.0–100.0)
Platelets: 394 10*3/uL (ref 150–400)
RBC: 2.08 MIL/uL — ABNORMAL LOW (ref 4.22–5.81)
RDW: 18.5 % — ABNORMAL HIGH (ref 11.5–15.5)
WBC: 7 10*3/uL (ref 4.0–10.5)
nRBC: 0.7 % — ABNORMAL HIGH (ref 0.0–0.2)

## 2019-01-18 LAB — BASIC METABOLIC PANEL
Anion gap: 13 (ref 5–15)
BUN: 9 mg/dL (ref 8–23)
CO2: 21 mmol/L — ABNORMAL LOW (ref 22–32)
Calcium: 8.9 mg/dL (ref 8.9–10.3)
Chloride: 103 mmol/L (ref 98–111)
Creatinine, Ser: 0.94 mg/dL (ref 0.61–1.24)
GFR calc Af Amer: >60 mL/min (ref >60)
GFR calc non Af Amer: >60 mL/min (ref >60)
Glucose, Bld: 89 mg/dL (ref 70–99)
Potassium: 3.6 mmol/L (ref 3.5–5.1)
Sodium: 137 mmol/L (ref 135–145)

## 2019-01-18 LAB — POC OCCULT BLOOD, ED: Fecal Occult Bld: NEGATIVE

## 2019-01-18 LAB — IRON AND TIBC
Iron: 17 ug/dL — ABNORMAL LOW (ref 45–182)
Saturation Ratios: 3 % — ABNORMAL LOW (ref 17.9–39.5)
TIBC: 528 ug/dL — ABNORMAL HIGH (ref 250–450)
UIBC: 511 ug/dL

## 2019-01-18 LAB — PREPARE RBC (CROSSMATCH)

## 2019-01-18 LAB — HIV ANTIBODY (ROUTINE TESTING W REFLEX): HIV Screen 4th Generation wRfx: NONREACTIVE

## 2019-01-18 LAB — VITAMIN B12: Vitamin B-12: 287 pg/mL (ref 180–914)

## 2019-01-18 LAB — PHOSPHORUS: Phosphorus: 3.5 mg/dL (ref 2.5–4.6)

## 2019-01-18 LAB — MAGNESIUM: Magnesium: 2 mg/dL (ref 1.7–2.4)

## 2019-01-18 MED ORDER — PANTOPRAZOLE SODIUM 40 MG IV SOLR
40.0000 mg | Freq: Once | INTRAVENOUS | Status: AC
  Administered 2019-01-18: 40 mg via INTRAVENOUS
  Filled 2019-01-18: qty 40

## 2019-01-18 MED ORDER — ALBUTEROL SULFATE HFA 108 (90 BASE) MCG/ACT IN AERS
2.0000 | INHALATION_SPRAY | RESPIRATORY_TRACT | Status: DC | PRN
  Administered 2019-01-18: 2 via RESPIRATORY_TRACT
  Filled 2019-01-18: qty 6.7

## 2019-01-18 MED ORDER — SODIUM CHLORIDE 0.9 % IV SOLN: 10.0000 mL/h | Freq: Once | INTRAVENOUS | Status: DC

## 2019-01-18 MED ORDER — SODIUM CHLORIDE 0.9% IV SOLUTION: Freq: Once | INTRAVENOUS | Status: DC

## 2019-01-18 MED ORDER — SODIUM CHLORIDE 0.9 % IV SOLN
INTRAVENOUS | Status: DC
  Administered 2019-01-18 – 2019-01-21 (×5): via INTRAVENOUS

## 2019-01-18 NOTE — ED Triage Notes (Signed)
Pt now w/multiple complaints. reports SOB, feeling weak, problems with bowel and bladder for months

## 2019-01-18 NOTE — ED Notes (Signed)
Patient is receiving blood at this time.

## 2019-01-18 NOTE — ED Triage Notes (Signed)
Pt presents w/headache and acid reflux x1 week, states he "fell out" 2 weeks ago.

## 2019-01-18 NOTE — ED Provider Notes (Addendum)
[facility] EMERGENCY DEPARTMENT Provider Note   CSN: [id number] Arrival date & time: 01/18/19  1312     History   Chief Complaint Chief Complaint  Patient presents with   Gastroesophageal Reflux   Headache    HPI Dylan Duncan is a 63 y.o. male.     HPI Patient reports that he has been getting extremely weak and fatigued.  He reports that he cannot even walk across the room without getting so short of breath he feels like he has to sit down.  He reports yesterday he was with a friend and had just eaten something and then stood up and got so lightheaded that he briefly passed out.  He denies he had gotten any injury from that.  He reports he has almost no energy to do anything.  Patient denies that he has vomited any bloody looking material or coffee-ground material.  He reports that he does occasionally vomit over the past several weeks but is usually clear or yellow looking.  He reports he does drink about 2 beers a day.  He reports his bowel movements have been very hard.  He reports he thinks they have looked dark but he has not had any bloody looking or loose stool.  Patient reports he has kind of a "sandy" or uncomfortable feeling when he is eating or swallowing.  He reports he did try some of a friend's antacid but has not been taking any regularly.  He reports he has had reflux in the past.  He denies are any regularly prescribed medications that he supposed to be taking right now.  He reports he does have COPD and oftentimes will use an inhaler.  He denies he has been having chest pain with exertion but does note sometimes he feels like he is got a "ball-like" sensation in his chest or upper abdomen.  He reports he has lost weight recently.  Patient reports regular marijuana smoking but does not smoke cigarettes.  He reports he had seen a doctor for his prostate in the past.  He reports that he does not have any specific diagnosis but was having problems with  erectile dysfunction and he did not follow-up. Past Medical History:  Diagnosis Date   Acid reflux    Allergy    Asthma    Cataract    COPD (chronic obstructive pulmonary disease) (HCC)    ?Uses Adviar?   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   Liver fibrosis    fibrotest F4,elastography F2-F3   Post traumatic stress disorder (PTSD)    Substance abuse [facility])     Patient Active Problem List   Diagnosis Date Noted   Sprain of right knee 08/18/2018   Asymptomatic gallstones 08/04/2012   Steatohepatitis, HCV & EtOH 08/04/2012   History of ETOH abuse 08/04/2012   Portal hypertensive gastropathy (HCC) 06/27/2012   Hematemesis 06/26/2012   Hepatitis C    Asthma     Past Surgical History:  Procedure Laterality Date   arm surgery Right    ESOPHAGOGASTRODUODENOSCOPY N/A 06/27/2012   Procedure: ESOPHAGOGASTRODUODENOSCOPY (EGD);  Surgeon: [name], MD;  Location: [facility] ENDOSCOPY;  Service: Endoscopy;  Laterality: N/A;   MANDIBLE SURGERY          Home Medications    Prior to Admission medications  Medication Sig Start Date End Date Taking? Authorizing Provider  albuterol (PROVENTIL HFA;VENTOLIN HFA) 108 (90 Base) MCG/ACT inhaler Inhale 2 puffs into the lungs every 4 (four) hours as needed for wheezing or shortness of breath. 04/06/17   Pricilla LovelessGoldston, Scott, MD  cephALEXin (KEFLEX) 500 MG capsule Take 1 capsule (500 mg total) by mouth 2 (two) times daily. 02/26/18   Bethel BornGekas, Kelly Marie, PA-C  cetirizine (ZYRTEC) 10 MG tablet Take 10 mg by mouth daily.    [provider]  Fluticasone-Salmeterol (ADVAIR DISKUS) 250-50 MCG/DOSE AEPB Inhale 1 puff into the lungs every 12 (twelve) hours. 04/06/17   Pricilla LovelessGoldston, Scott, MD  PEG-KCl-NaCl-NaSulf-Na Asc-C (PLENVU) 140 g SOLR Take 140 g by mouth as directed. 03/24/17   Sherrilyn Ristanis, Henry L III,  MD  ranitidine (ZANTAC) 75 MG tablet Take 75 mg by mouth daily.    [provider]    Family History Family History  Problem Relation Age of Onset   Pneumonia Mother    Heart disease Father        Hx of MI   Colon cancer Neg Hx    Esophageal cancer Neg Hx    Liver cancer Neg Hx    Pancreatic cancer Neg Hx    Rectal cancer Neg Hx    Stomach cancer Neg Hx     Social History Social History   Tobacco Use   Smoking status: Former Smoker    Packs/day: 0.14    Years: 4.00    Pack years: 0.56    Types: Cigarettes    Quit date: 03/17/2010    Years since quitting: 8.8   Smokeless tobacco: Never Used  Substance Use Topics   Alcohol use: Yes    Alcohol/week: 2.0 standard drinks    Types: 2 Cans of beer per week    Comment: Daily.   Drug use: Yes    Frequency: 2.0 times per week    Types: Marijuana     Allergies   Lactose intolerance (gi) and Penicillins   Review of Systems Review of Systems 10 Systems reviewed and are negative for acute change except as noted in the HPI.   Physical Exam Updated Vital Signs BP 122/70    Pulse 78    Temp 98.9 F (37.2 C) (Oral)    Resp 14    SpO2 100%   Physical Exam Constitutional:      Comments: Alert and nontoxic.  Very thin.  No respiratory distress.  Mental status is clear.  HENT:     Head: Normocephalic and atraumatic.  Eyes:     Extraocular Movements: Extraocular movements intact.  Cardiovascular:     Rate and Rhythm: Normal rate and regular rhythm.  Pulmonary:     Effort: Pulmonary effort is normal.     Breath sounds: Normal breath sounds.     Comments: Occasional expiratory wheeze and breath sounds slightly soft with good airflow and no respiratory distress. Abdominal:     General: There is no distension.     Palpations: Abdomen is soft.     Tenderness: There is no abdominal tenderness. There is no guarding.  Genitourinary:    Comments: Rectal exam: Prostate is enlarged.  No stool in the vault.   Trace amount of brownish stool.  No melena and no frank blood. Musculoskeletal: Normal range of motion.        General: No swelling or tenderness.     Right lower leg: No edema.     Left lower leg: No edema.  Comments: Extremities are extremely thin.  No peripheral edema.  Peripheral pulses intact.  Skin:    General: Skin is warm and dry.     Coloration: Skin is pale.  Neurological:     General: No focal deficit present.     Mental Status: He is oriented to person, place, and time.     Motor: No weakness.     Coordination: Coordination normal.  Psychiatric:        Mood and Affect: Mood normal.      ED Treatments / Results  Labs (all labs ordered are listed, but only abnormal results are displayed) Labs Reviewed  BASIC METABOLIC PANEL - Abnormal; Notable for the following components:      Result Value   CO2 21 (*)    All other components within normal limits  CBC - Abnormal; Notable for the following components:   RBC 2.08 (*)    Hemoglobin 3.5 (*)    HCT 13.3 (*)    MCV 63.9 (*)    MCH 16.8 (*)    MCHC 26.3 (*)    RDW 18.5 (*)    nRBC 0.7 (*)    All other components within normal limits  I-STAT CHEM 8, ED - Abnormal; Notable for the following components:   Calcium, Ion 1.14 (*)    Hemoglobin 5.1 (*)    HCT 15.0 (*)    All other components within normal limits  PROTIME-INR  BASIC METABOLIC PANEL  POC OCCULT BLOOD, ED  PREPARE RBC (CROSSMATCH)  TYPE AND SCREEN    EKG EKG Interpretation  Date/Time:  Tuesday January 18 2019 16:35:04 EST Ventricular Rate:  101 PR Interval:    QRS Duration: 79 QT Interval:  329 QTC Calculation: 427 R Axis:   83 Text Interpretation: Sinus tachycardia Borderline right axis deviation Minimal ST depression, diffuse leads no sig change from previous Confirmed by Charlesetta Shanks [id number]) on 01/18/2019 6:56:54 PM   Radiology Dg Chest 2 View  Result Date: 01/18/2019 CLINICAL DATA:  Shortness of breath, weakness. EXAM: CHEST - 2  VIEW COMPARISON:  04/06/2017. FINDINGS: Trachea is midline. Heart size normal. Biapical pleuroparenchymal scarring and bullous lesions. Lungs are hyperexpanded but otherwise clear. No pleural fluid. IMPRESSION: Hyperinflation without acute finding. Electronically Signed   By: Lorin Picket M.D.   On: 01/18/2019 14:35    Procedures Procedures (including critical care time) CRITICAL CARE Performed by: Charlesetta Shanks   Total critical care time: 30 minutes  Critical care time was exclusive of separately billable procedures and treating other patients.  Critical care was necessary to treat or prevent imminent or life-threatening deterioration.  Critical care was time spent personally by me on the following activities: development of treatment plan with patient and/or surrogate as well as nursing, discussions with consultants, evaluation of patient's response to treatment, examination of patient, obtaining history from patient or surrogate, ordering and performing treatments and interventions, ordering and review of laboratory studies, ordering and review of radiographic studies, pulse oximetry and re-evaluation of patient's condition. Medications Ordered in ED Medications  0.9 %  sodium chloride infusion (has no administration in time range)  pantoprazole (PROTONIX) injection 40 mg (40 mg Intravenous Given 01/18/19 1901)     Initial Impression / Assessment and Plan / ED Course  I have reviewed the triage vital signs and the nursing notes.  Pertinent labs & imaging results that were available during my care of the patient were reviewed by me and considered in my medical decision making (see chart for  details).  Clinical Course as of Jan 17 1913  Tue Jan 18, 2019  1912 Consult: Reviewed with Dr.Cristescu [facility] hospitalist for admission.   [MP]    Clinical Course User Index [MP] Arby Barrette, MD      Patient presents with symptomatic anemia.  Surprisingly, with hemoglobin of 3.5,  repeated and corraborated by i-STAT at 5, patient does have stable vital signs, clear mental status and no respiratory distress at rest.  He however is having symptomatic anemia.  He reports he had a syncopal episode yesterday and become so dyspneic with mild exertion that he has to rest with simple activities.  He does not have any melena.  The occult has tested negative.  Will initiate blood transfusion and admit for continued transfusion and evaluation of anemia.  Final Clinical Impressions(s) / ED Diagnoses   Final diagnoses:  Symptomatic anemia    ED Discharge Orders    None       Arby Barrette, MD 01/18/19 1900    Arby Barrette, MD 01/18/19 1914

## 2019-01-18 NOTE — H&P (Signed)
History and Physical    Dylan Bootydmond A Grenfell ZOX:[id number] DOB: [birth year] DOA: 01/18/2019  PCP: Patient, No Pcp Per    Patient coming from: Home    Chief Complaint: Shortness of breath general weakness  HPI: Dylan Duncan is a 63 y.o. male with medical history significant of hepatitis C, chronic alcohol, COPD,GERD came with a chief complaint shortness of breath lightheadedness generalized weakness.  Patient states had an episode of lightheadedness almost near syncopal episode yesterday.  Pain he denies any recent vomiting.  Patient states head few days ago some black stools.  He drinks 2 beers a day. In the emergency room he had a hemoglobin of 3.5 and after 1 unit of blood 5.1 PT PTT INR normal With stable hemodynamically Electrocardiogram sinus tachycardia no acute ST-T changes  ED Course:  In the emergency room he was found to have Hemoglobin 3.5 was given 1 unit of blood and hemoglobin was 5.1 INR, PT PTT are normal Liver function pending Rectal exam was Hemoccult was negative by ED physician Denies any vomiting  Review of Systems:   No fever General weakness Skin pain Shortness of breath denies any cough Denies chest pain Denies abdominal pain No dysuria or polyuria or polyuria Musculoskeletal no joint pain No peripheral edema No focal neurologic deficits, general weakness No signs of depression  Past Medical History:  Diagnosis Date  . Acid reflux   . Allergy   . Asthma   . Cataract   . COPD (chronic obstructive pulmonary disease) (HCC)    ?Uses Adviar?  . Emphysema of lung (HCC)   . GERD (gastroesophageal reflux disease)   . Hepatic steatosis   . 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 Liver fibrosis    fibrotest F4,elastography F2-F3  . Post traumatic stress disorder (PTSD)   . Substance abuse [facility](HCC)     Past Surgical History:  Procedure Laterality Date  . arm surgery Right   .  ESOPHAGOGASTRODUODENOSCOPY N/A 06/27/2012   Procedure: ESOPHAGOGASTRODUODENOSCOPY (EGD);  Surgeon: Petra KubaMarc E Magod, MD;  [facility];  Service: Endoscopy;  Laterality: N/A;  . MANDIBLE SURGERY       reports that he quit smoking about 8 years ago. His smoking use included cigarettes. He has a 0.56 pack-year smoking history. He has never used smokeless tobacco. He reports current alcohol use of about 2.0 standard drinks of alcohol per week. He reports current drug use. Frequency: 2.00 times per week. Drug: Marijuana.  Allergies  Allergen Reactions  . Lactose Intolerance (Gi) Other (See Comments)    Throws up  . Penicillins Itching    Family History  Problem Relation Age of Onset  . Pneumonia Mother   . Heart disease Father        Hx of MI  . Colon cancer Neg Hx   . Esophageal cancer Neg Hx   . Liver cancer Neg Hx   . Pancreatic cancer Neg Hx   . Rectal cancer Neg Hx   . Stomach cancer Neg Hx      Prior to Admission medications   Medication Sig Start Date End Date Taking? Authorizing Provider  albuterol (PROVENTIL HFA;VENTOLIN HFA) 108 (90 Base) MCG/ACT inhaler Inhale 2 puffs into the lungs every 4 (four) hours as needed for wheezing or shortness of breath. 04/06/17  Yes Pricilla LovelessGoldston, Scott, MD  Fluticasone-Salmeterol (ADVAIR DISKUS) 250-50 MCG/DOSE AEPB Inhale 1 puff into the lungs every 12 (twelve) hours. Patient  taking differently: Inhale 1 puff into the lungs 2 (two) times daily as needed (Shortness of breath or wheezing).  04/06/17  Yes Pricilla Loveless, MD  oxyCODONE-acetaminophen (PERCOCET) 10-325 MG tablet Take 1 tablet by mouth every 8 (eight) hours as needed for pain. 09/03/18   [provider]  PEG-KCl-NaCl-NaSulf-Na Asc-C (PLENVU) 140 g SOLR Take 140 g by mouth as directed. Patient not taking: Reported on 01/18/2019 03/24/17   Sherrilyn Rist, MD    Physical Exam: Vitals:   01/18/19 2130 01/18/19 2141 01/18/19 2145 01/18/19 2204  BP: 123/73 123/73 137/67  132/68  Pulse: 79 75 72 74  Resp:  18  18  Temp:  98.8 F (37.1 C)  99.3 F (37.4 C)  TempSrc:  Oral  Oral  SpO2: 100%  100% 100%    Constitutional: NAD, calm, comfortable Vitals:   01/18/19 2130 01/18/19 2141 01/18/19 2145 01/18/19 2204  BP: 123/73 123/73 137/67 132/68  Pulse: 79 75 72 74  Resp:  18  18  Temp:  98.8 F (37.1 C)  99.3 F (37.4 C)  TempSrc:  Oral  Oral  SpO2: 100%  100% 100%   Eyes: PERRL, lids and conjunctivae normal ENMT: Mucous membranes are moist. Posterior pharynx clear of any exudate or lesions.Normal dentition.  Neck: normal, supple, no masses, no thyromegaly Respiratory: clear to auscultation bilaterally, no wheezing, no crackles. Normal respiratory effort. No accessory muscle use.  Cardiovascular: Regular rate and rhythm, no murmurs / rubs / gallops. No extremity edema. 2+ pedal pulses. No carotid bruits.  Abdomen: no tenderness, no masses palpated. No hepatosplenomegaly. Bowel sounds positive.  Musculoskeletal: no clubbing / cyanosis. No joint deformity upper and lower extremities. Good ROM, no contractures. Normal muscle tone.  Skin: no rashes, lesions, ulcers. No induration Neurologic: CN 2-12 grossly intact. Sensation intact, DTR normal. Strength 5/5 in all 4.  Psychiatric: Normal judgment and insight. Alert and oriented x 3. Normal mood.    Labs on Admission: I have personally reviewed following labs and imaging studies  CBC: Recent Labs  Lab 01/18/19 1408 01/18/19 1658  WBC 7.0  --   HGB 3.5* 5.1*  HCT 13.3* 15.0*  MCV 63.9*  --   PLT 394  --    Basic Metabolic Panel: Recent Labs  Lab 01/18/19 1408 01/18/19 1658  NA 137 136  K 3.6 3.9  CL 103 102  CO2 21*  --   GLUCOSE 89 92  BUN 9 10  CREATININE 0.94 0.90  CALCIUM 8.9  --    GFR: CrCl cannot be calculated (Unknown ideal weight.). Liver Function Tests: No results for input(s): AST, ALT, ALKPHOS, BILITOT, PROT, ALBUMIN in the last 168 hours. No results for input(s):  LIPASE, AMYLASE in the last 168 hours. No results for input(s): AMMONIA in the last 168 hours. Coagulation Profile: Recent Labs  Lab 01/18/19 1650  INR 1.2   Cardiac Enzymes: No results for input(s): CKTOTAL, CKMB, CKMBINDEX, TROPONINI in the last 168 hours. BNP (last 3 results) No results for input(s): PROBNP in the last 8760 hours. HbA1C: No results for input(s): HGBA1C in the last 72 hours. CBG: No results for input(s): GLUCAP in the last 168 hours. Lipid Profile: No results for input(s): CHOL, HDL, LDLCALC, TRIG, CHOLHDL, LDLDIRECT in the last 72 hours. Thyroid Function Tests: No results for input(s): TSH, T4TOTAL, FREET4, T3FREE, THYROIDAB in the last 72 hours. Anemia Panel: No results for input(s): VITAMINB12, FOLATE, FERRITIN, TIBC, IRON, RETICCTPCT in the last 72 hours. Urine analysis:  Component Value Date/Time   COLORURINE YELLOW 02/26/2018 1002   APPEARANCEUR CLOUDY (A) 02/26/2018 1002   LABSPEC 1.016 02/26/2018 1002   PHURINE 5.0 02/26/2018 1002   GLUCOSEU NEGATIVE 02/26/2018 1002   HGBUR SMALL (A) 02/26/2018 1002   BILIRUBINUR NEGATIVE 02/26/2018 1002   KETONESUR NEGATIVE 02/26/2018 1002   PROTEINUR NEGATIVE 02/26/2018 1002   NITRITE NEGATIVE 02/26/2018 1002   LEUKOCYTESUR LARGE (A) 02/26/2018 1002    Radiological Exams on Admission: Dg Chest 2 View  Result Date: 01/18/2019 CLINICAL DATA:  Shortness of breath, weakness. EXAM: CHEST - 2 VIEW COMPARISON:  04/06/2017. FINDINGS: Trachea is midline. Heart size normal. Biapical pleuroparenchymal scarring and bullous lesions. Lungs are hyperexpanded but otherwise clear. No pleural fluid. IMPRESSION: Hyperinflation without acute finding. Electronically Signed   By: Lorin Picket M.D.   On: 01/18/2019 14:35    EKG: Independently reviewed.   Assessment plan  Severe anemia secondary to GI bleed Patient denies vomiting Hemoccult was negative test done by emergency room physician Patient states had some black  stools in the last few days Hemoglobin 3.5 Plan blood transfusion, follow H&H, consult GI for upper endoscopy and colonoscopy Check iron level ferritin and TIBC vitamin W29 and folic acid  Near syncopal episode Secondary to severe anemia Electrocardiogram sinus rhythm no acute ST-T changes Check troponin  History of hepatitis C Patient states had treatment for that does not remember where and when  History of alcohol abuse Drinks 2 beers a day Denies any withdrawal symptoms Plan Ativan IV as needed  History of COPD Resume inhaler  Assessment/Plan Active Problems:   Severe anemia   GI bleed      DVT prophylaxis: Contraindicated because of the bleeding Code Status: Full code Family Communication: Yes Disposition Plan: Upper endoscopy and colonoscopy Consults called: Yes by emergency room physician Admission status: Inpatient   Kaizley Aja G Asley Baskerville MD [facility]  If 7PM-7AM, please contact night-coverage [email or web address]   01/18/2019, 10:31 PM

## 2019-01-19 DIAGNOSIS — K922 Gastrointestinal hemorrhage, unspecified: Secondary | ICD-10-CM

## 2019-01-19 DIAGNOSIS — D649 Anemia, unspecified: Secondary | ICD-10-CM

## 2019-01-19 LAB — TSH
TSH: 1.59 u[IU]/mL (ref 0.350–4.500)
TSH: 1.512 u[IU]/mL (ref 0.350–4.500)

## 2019-01-19 LAB — COMPREHENSIVE METABOLIC PANEL
ALT: 9 U/L (ref 0–44)
AST: 16 U/L (ref 15–41)
Albumin: 3.4 g/dL — ABNORMAL LOW (ref 3.5–5.0)
Alkaline Phosphatase: 43 U/L (ref 38–126)
Anion gap: 10 (ref 5–15)
BUN: 10 mg/dL (ref 8–23)
CO2: 19 mmol/L — ABNORMAL LOW (ref 22–32)
Calcium: 9 mg/dL (ref 8.9–10.3)
Chloride: 107 mmol/L (ref 98–111)
Creatinine, Ser: 0.86 mg/dL (ref 0.61–1.24)
GFR calc Af Amer: >60 mL/min (ref >60)
GFR calc non Af Amer: >60 mL/min (ref >60)
Glucose, Bld: 99 mg/dL (ref 70–99)
Potassium: 3.8 mmol/L (ref 3.5–5.1)
Sodium: 136 mmol/L (ref 135–145)
Total Bilirubin: 1 mg/dL (ref 0.3–1.2)
Total Protein: 6.7 g/dL (ref 6.5–8.1)

## 2019-01-19 LAB — HEPATIC FUNCTION PANEL
ALT: 10 U/L (ref 0–44)
AST: 17 U/L (ref 15–41)
Albumin: 3.5 g/dL (ref 3.5–5.0)
Alkaline Phosphatase: 44 U/L (ref 38–126)
Bilirubin, Direct: 0.2 mg/dL (ref 0.0–0.2)
Indirect Bilirubin: 1.2 mg/dL — ABNORMAL HIGH (ref 0.3–0.9)
Total Bilirubin: 1.4 mg/dL — ABNORMAL HIGH (ref 0.3–1.2)
Total Protein: 6.9 g/dL (ref 6.5–8.1)

## 2019-01-19 LAB — SARS CORONAVIRUS 2 (TAT 6-24 HRS): SARS Coronavirus 2: NEGATIVE

## 2019-01-19 LAB — TROPONIN I (HIGH SENSITIVITY)
Troponin I (High Sensitivity): 5 ng/L (ref <18)
Troponin I (High Sensitivity): 6 ng/L (ref <18)

## 2019-01-19 LAB — HEMOGLOBIN AND HEMATOCRIT, BLOOD
HCT: 22.3 % — ABNORMAL LOW (ref 39.0–52.0)
HCT: 28.8 % — ABNORMAL LOW (ref 39.0–52.0)
Hemoglobin: 6.7 g/dL — CL (ref 13.0–17.0)
Hemoglobin: 9 g/dL — ABNORMAL LOW (ref 13.0–17.0)

## 2019-01-19 LAB — PREPARE RBC (CROSSMATCH)

## 2019-01-19 LAB — APTT: aPTT: 27 seconds (ref 24–36)

## 2019-01-19 MED ORDER — PEG-KCL-NACL-NASULF-NA ASC-C 100 G PO SOLR: 1.0000 | Freq: Once | ORAL | Status: DC

## 2019-01-19 MED ORDER — SODIUM CHLORIDE 0.9% IV SOLUTION: Freq: Once | INTRAVENOUS | Status: DC

## 2019-01-19 MED ORDER — PANTOPRAZOLE SODIUM 40 MG IV SOLR
40.0000 mg | Freq: Two times a day (BID) | INTRAVENOUS | Status: DC
  Administered 2019-01-19 – 2019-01-20 (×5): 40 mg via INTRAVENOUS
  Filled 2019-01-19 (×5): qty 40

## 2019-01-19 MED ORDER — ONDANSETRON HCL 4 MG/2ML IJ SOLN
4.0000 mg | Freq: Four times a day (QID) | INTRAMUSCULAR | Status: DC | PRN
  Administered 2019-01-19: 4 mg via INTRAVENOUS
  Filled 2019-01-19: qty 2

## 2019-01-19 MED ORDER — PEG-KCL-NACL-NASULF-NA ASC-C 100 G PO SOLR
1.0000 | Freq: Once | ORAL | Status: AC
  Administered 2019-01-19: 200 g via ORAL
  Filled 2019-01-19: qty 1

## 2019-01-19 MED ORDER — SODIUM CHLORIDE 0.9% FLUSH: 3.0000 mL | INTRAVENOUS | Status: DC | PRN

## 2019-01-19 MED ORDER — SODIUM CHLORIDE 0.9% FLUSH
3.0000 mL | Freq: Two times a day (BID) | INTRAVENOUS | Status: DC
  Administered 2019-01-19 – 2019-01-20 (×3): 3 mL via INTRAVENOUS

## 2019-01-19 MED ORDER — FLUTICASONE FUROATE-VILANTEROL 200-25 MCG/INH IN AEPB
1.0000 | INHALATION_SPRAY | Freq: Every day | RESPIRATORY_TRACT | Status: DC
  Administered 2019-01-19 – 2019-01-21 (×2): 1 via RESPIRATORY_TRACT
  Filled 2019-01-19 (×2): qty 28

## 2019-01-19 MED ORDER — ALBUTEROL SULFATE (2.5 MG/3ML) 0.083% IN NEBU: 2.5000 mg | INHALATION_SOLUTION | RESPIRATORY_TRACT | Status: DC | PRN

## 2019-01-19 MED ORDER — SODIUM CHLORIDE 0.9 % IV SOLN: 250.0000 mL | INTRAVENOUS | Status: DC | PRN

## 2019-01-19 NOTE — ED Notes (Signed)
Ordered diet tray for pt  

## 2019-01-19 NOTE — ED Notes (Signed)
Report given to Sharmon Leyden, RN on 3E

## 2019-01-19 NOTE — Consult Note (Signed)
Referring Provider:    [facility]       Primary Care Physician:  Dylan Duncan, No Pcp Per Primary Gastroenterologist:   Amada Jupiter, MD          Reason for Consultation:   Anemia, dark heme negative stools              ASSESSMENT /  PLAN    1. 63 yo male with history of treated HCV (responder), chronic liver disease ( ? Cirrhosis) presented with profound iron deficiency anemia.  Has had some black stools within last few weeks so seem like upper bleed BUT his BUN is normal, he is heme negative and has recently taken bismuth.  If upper bleed then rule out PUD, portal hypertensive gastropathy, varices.  -He is s/p 4 uPRBC with improvement in hgb from 3.5 to 6.7. -agree with bid PPI -Will need EGD but also consider colonoscopy. Keep NPO in case we elect to do EGD today.   2. Chronic liver disease.  [place] elastrography 2017 prior to HCV treatment showed Metavir fibrosis score is F2 + some F3, Moderate risk for fibrosis. HCV treated but has continued to drink so progression of liver disease is possible.    HPI:     Dylan Duncan is a 63 y.o. male with pmh significant for COPD, PTSD, GERD, cholelithiasis, chronic liver disease. Hx of HCV s/p treatment with negative follow up PCR.. Fibro-test prior to HCV treatment estimated grade 4 fibrosis. We saw Dylan Duncan one time in outpatient setting in early Jan 2019 . He had been referred by Hepatology clinic for anemia, colon cancer screening, weight loss and constipation. We arranged for EGD for GERD symptoms and also colonoscopy but neither study was done. He lives alone and couldn't mange getting procedures done.  Dylan Duncan hasn't been seen since. Weight down 5 pounds since June per Epic  ED evaluation:   Dylan Duncan presented to ED yesterday with multiple complaints : headache, reflux, SIOB, weakness, weight loss. Reported recent black stools. H 'fell out" a few days ago.    Hgb 3.5, baseline ~ 12. MCV 63, platelets 394.  Heme negative BUN 10, Cr 0.86,  albumin 3.4, liver tests o/w normal. INR 1.2 TSH 1.5  Dylan Duncan takes acid reflux medicine as needed. He has been trying to manage chronic constipation with diet. He hasn't had any rectal bleeding. The dark stools started a couple of weeks ago but he did take some medication with Bismuth. No NSAID use. No abdominal pain. He has had some nausea / vomiting of non-bloody emesis.   Previous Endoscopies:   EGD 2014 for hematemesis Mild proximal gastropathy.    Past Medical History:  Diagnosis Date  . Acid reflux   . Allergy   . Asthma   . Cataract   . COPD (chronic obstructive pulmonary disease) (HCC)    ?Uses Adviar?  . Emphysema of lung (HCC)   . GERD (gastroesophageal reflux disease)   . Hepatic steatosis   . 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 Liver fibrosis    fibrotest F4,elastography F2-F3  . Post traumatic stress disorder (PTSD)   . Substance abuse [facility])     Past Surgical History:  Procedure Laterality Date  . arm surgery Right   . ESOPHAGOGASTRODUODENOSCOPY N/A 06/27/2012   Procedure: ESOPHAGOGASTRODUODENOSCOPY (EGD);  Surgeon: [name], MD;  [facility] ENDOSCOPY;  Service: Endoscopy;  Laterality: N/A;  . MANDIBLE  SURGERY      Prior to Admission medications   Medication Sig Start Date End Date Taking? Authorizing Provider  albuterol (PROVENTIL HFA;VENTOLIN HFA) 108 (90 Base) MCG/ACT inhaler Inhale 2 puffs into the lungs every 4 (four) hours as needed for wheezing or shortness of breath. 04/06/17  Yes Pricilla LovelessGoldston, Scott, MD  Fluticasone-Salmeterol (ADVAIR DISKUS) 250-50 MCG/DOSE AEPB Inhale 1 puff into the lungs every 12 (twelve) hours. Dylan Duncan taking differently: Inhale 1 puff into the lungs 2 (two) times daily as needed (Shortness of breath or wheezing).  04/06/17  Yes Pricilla LovelessGoldston, Scott, MD  oxyCODONE-acetaminophen (PERCOCET) 10-325 MG tablet Take 1 tablet by mouth every 8 (eight) hours as needed for pain. 09/03/18    [provider]  PEG-KCl-NaCl-NaSulf-Na Asc-C (PLENVU) 140 g SOLR Take 140 g by mouth as directed. Dylan Duncan not taking: Reported on 01/18/2019 03/24/17   Sherrilyn Ristanis, Henry L III, MD    Current Facility-Administered Medications  Medication Dose Route Frequency Provider Last Rate Last Dose  . 0.9 %  sodium chloride infusion (Manually program via Guardrails IV Fluids)   Intravenous Once Vann, Jessica U, DO      . 0.9 %  sodium chloride infusion  10 mL/hr Intravenous Once Pfeiffer, Lebron ConnersMarcy, MD      . 0.9 %  sodium chloride infusion  250 mL Intravenous PRN Cristescu, Mircea G, MD      . 0.9 %  sodium chloride infusion   Intravenous Continuous Cristescu, Victorino SparrowMircea G, MD 75 mL/hr at 01/18/19 2103    . albuterol (VENTOLIN HFA) 108 (90 Base) MCG/ACT inhaler 2 puff  2 puff Inhalation Q4H PRN Cristescu, Victorino SparrowMircea G, MD   2 puff at 01/18/19 2132  . fluticasone furoate-vilanterol (BREO ELLIPTA) 200-25 MCG/INH 1 puff  1 puff Inhalation Daily Cristescu, Mircea G, MD      . pantoprazole (PROTONIX) injection 40 mg  40 mg Intravenous Q12H Cristescu, Mircea G, MD   40 mg at 01/19/19 0142  . sodium chloride flush (NS) 0.9 % injection 3 mL  3 mL Intravenous Q12H Cristescu, Victorino SparrowMircea G, MD   3 mL at 01/19/19 0137  . sodium chloride flush (NS) 0.9 % injection 3 mL  3 mL Intravenous PRN Cristescu, Victorino SparrowMircea G, MD       Current Outpatient Medications  Medication Sig Dispense Refill  . albuterol (PROVENTIL HFA;VENTOLIN HFA) 108 (90 Base) MCG/ACT inhaler Inhale 2 puffs into the lungs every 4 (four) hours as needed for wheezing or shortness of breath. 1 Inhaler 1  . Fluticasone-Salmeterol (ADVAIR DISKUS) 250-50 MCG/DOSE AEPB Inhale 1 puff into the lungs every 12 (twelve) hours. (Dylan Duncan taking differently: Inhale 1 puff into the lungs 2 (two) times daily as needed (Shortness of breath or wheezing). ) 60 each 1  . oxyCODONE-acetaminophen (PERCOCET) 10-325 MG tablet Take 1 tablet by mouth every 8 (eight) hours as needed for pain.    Marland Kitchen.  PEG-KCl-NaCl-NaSulf-Na Asc-C (PLENVU) 140 g SOLR Take 140 g by mouth as directed. (Dylan Duncan not taking: Reported on 01/18/2019) 1 each 0    Allergies as of 01/18/2019 - Review Complete 01/18/2019  Allergen Reaction Noted  . Lactose intolerance (gi) Other (See Comments) 05/11/2012  . Penicillins Itching 05/11/2012    Family History  Problem Relation Age of Onset  . Pneumonia Mother   . Heart disease Father        Hx of MI  . Colon cancer Neg Hx   . Esophageal cancer Neg Hx   . Liver cancer Neg Hx   . Pancreatic  cancer Neg Hx   . Rectal cancer Neg Hx   . Stomach cancer Neg Hx     Social History   Socioeconomic History  . Marital status: Single    Spouse name: Not on file  . Number of children: Not on file  . Years of education: Not on file  . Highest education level: Not on file  Occupational History  . Occupation: disabled    Comment: former Set designer  Social Needs  . Financial resource strain: Not on file  . Food insecurity    Worry: Not on file    Inability: Not on file  . Transportation needs    Medical: Not on file    Non-medical: Not on file  Tobacco Use  . Smoking status: Former Smoker    Packs/day: 0.14    Years: 4.00    Pack years: 0.56    Types: Cigarettes    Quit date: 03/17/2010    Years since quitting: 8.8  . Smokeless tobacco: Never Used  Substance and Sexual Activity  . Alcohol use: Yes    Alcohol/week: 2.0 standard drinks    Types: 2 Cans of beer per week    Comment: Daily.  . Drug use: Yes    Frequency: 2.0 times per week    Types: Marijuana  . Sexual activity: Yes    Birth control/protection: Condom  Lifestyle  . Physical activity    Days per week: Not on file    Minutes per session: Not on file  . Stress: Not on file  Relationships  . Social Musician on phone: Not on file    Gets together: Not on file    Attends religious service: Not on file    Active member of club or organization: Not on file    Attends meetings  of clubs or organizations: Not on file    Relationship status: Not on file  . Intimate partner violence    Fear of current or ex partner: Not on file    Emotionally abused: Not on file    Physically abused: Not on file    Forced sexual activity: Not on file  Other Topics Concern  . Not on file  Social History Narrative  . Not on file    Review of Systems: All systems reviewed and negative except where noted in HPI.  Physical Exam: Vital signs in last 24 hours: Temp:  [98.6 F (37 C)-99.8 F (37.7 C)] 99.2 F (37.3 C) (11/04 1019) Pulse Rate:  [62-101] 67 (11/04 1019) Resp:  [10-24] 14 (11/04 1019) BP: (110-155)/(56-89) 130/70 (11/04 1019) SpO2:  [97 %-100 %] 100 % (11/04 1019)   General:   Alert, thin black in NAD Psych:  Pleasant, cooperative. Normal mood and affect. Eyes:  Pupils equal, sclera clear, no icterus.   Conjunctiva pink. Ears:  Normal auditory acuity. Nose:  No deformity, discharge,  or lesions. Neck:  Supple; no masses Lungs:  Clear throughout to auscultation.   No wheezes, crackles, or rhonchi.  Heart:  Regular rate and rhythm; no murmurs, no lower extremity edema Abdomen:  Soft, non-distended, nontender, BS active, no palp mass   Rectal:  Deferred  Msk:  Symmetrical without gross deformities. . Neurologic:  Alert and  oriented x4;  grossly normal neurologically. Skin:  Intact without significant lesions or rashes.   Intake/Output from previous day: 11/03 0701 - 11/04 0700 In: 1945 [Blood:1945] Out: -  Intake/Output this shift: Total I/O In: 315 [Blood:315] Out: 300 [  Urine:300]  Lab Results: Recent Labs    01/18/19 1408 01/18/19 1658 01/19/19 0154  WBC 7.0  --   --   HGB 3.5* 5.1* 6.7*  HCT 13.3* 15.0* 22.3*  PLT 394  --   --    BMET Recent Labs    01/18/19 1408 01/18/19 1658 01/19/19 0128  NA 137 136 136  K 3.6 3.9 3.8  CL 103 102 107  CO2 21*  --  19*  GLUCOSE 89 92 99  BUN 9 10 10   CREATININE 0.94 0.90 0.86  CALCIUM 8.9  --   9.0   LFT Recent Labs    01/19/19 0336  PROT 6.9  ALBUMIN 3.5  AST 17  ALT 10  ALKPHOS 44  BILITOT 1.4*  BILIDIR 0.2  IBILI 1.2*   PT/INR Recent Labs    01/18/19 1650  LABPROT 15.0  INR 1.2   Hepatitis Panel No results for input(s): HEPBSAG, HCVAB, HEPAIGM, HEPBIGM in the last 72 hours.   . CBC Latest Ref Rng & Units 01/19/2019 01/18/2019 01/18/2019  WBC 4.0 - 10.5 K/uL - - 7.0  Hemoglobin 13.0 - 17.0 g/dL 6.7(LL) 5.1(LL) 3.5(LL)  Hematocrit 39.0 - 52.0 % 22.3(L) 15.0(L) 13.3(L)  Platelets 150 - 400 K/uL - - 394    . CMP Latest Ref Rng & Units 01/19/2019 01/19/2019 01/18/2019  Glucose 70 - 99 mg/dL - 99 92  BUN 8 - 23 mg/dL - 10 10  Creatinine 0.61 - 1.24 mg/dL - 0.86 0.90  Sodium 135 - 145 mmol/L - 136 136  Potassium 3.5 - 5.1 mmol/L - 3.8 3.9  Chloride 98 - 111 mmol/L - 107 102  CO2 22 - 32 mmol/L - 19(L) -  Calcium 8.9 - 10.3 mg/dL - 9.0 -  Total Protein 6.5 - 8.1 g/dL 6.9 6.7 -  Total Bilirubin 0.3 - 1.2 mg/dL 1.4(H) 1.0 -  Alkaline Phos 38 - 126 U/L 44 43 -  AST 15 - 41 U/L 17 16 -  ALT 0 - 44 U/L 10 9 -   Studies/Results: Dg Chest 2 View  Result Date: 01/18/2019 CLINICAL DATA:  Shortness of breath, weakness. EXAM: CHEST - 2 VIEW COMPARISON:  04/06/2017. FINDINGS: Trachea is midline. Heart size normal. Biapical pleuroparenchymal scarring and bullous lesions. Lungs are hyperexpanded but otherwise clear. No pleural fluid. IMPRESSION: Hyperinflation without acute finding. Electronically Signed   By: Lorin Picket M.D.   On: 01/18/2019 14:35    Active Problems:   Severe anemia   GI bleed    Tye Savoy, NP-C @  01/19/2019, 10:32 AM

## 2019-01-19 NOTE — ED Notes (Signed)
Pt is NSR on monitor 

## 2019-01-19 NOTE — Progress Notes (Signed)
Patient says he will not drink the bowel prep, patient says it is making him sick and is not doing anything except making him urinate. RN explained that he hasn't had enough of the bowel prep and that he needs to drink more. Patient still refuses after a lengthy discussion, in which patient started to get really irritated. RN asked patient if he would like to sign the consent form just in case and he says "No, I'll sign it in the morning when they figure out what they want to do. I've had this stuff before and I couldn't drink it then either."

## 2019-01-19 NOTE — Anesthesia Preprocedure Evaluation (Addendum)
Anesthesia Evaluation  Patient identified by MRN, date of birth, ID band Patient awake    Reviewed: Allergy & Precautions, NPO status , Patient's Chart, lab work & pertinent test results  History of Anesthesia Complications Negative for: history of anesthetic complications  Airway Mallampati: I  TM Distance: >3 FB Neck ROM: Full    Dental  (+) Edentulous Upper, Edentulous Lower, Dental Advisory Given   Pulmonary asthma , COPD, former smoker,    Pulmonary exam normal        Cardiovascular negative cardio ROS Normal cardiovascular exam     Neuro/Psych PSYCHIATRIC DISORDERS Anxiety negative neurological ROS     GI/Hepatic GERD  ,(+) Hepatitis -, C  Endo/Other  negative endocrine ROS  Renal/GU negative Renal ROS     Musculoskeletal negative musculoskeletal ROS (+)   Abdominal   Peds  Hematology negative hematology ROS (+)   Anesthesia Other Findings Day of surgery medications reviewed with the patient.  Reproductive/Obstetrics                            Anesthesia Physical Anesthesia Plan  ASA: III  Anesthesia Plan: MAC   Post-op Pain Management:    Induction:   PONV Risk Score and Plan: 2 and Ondansetron and Propofol infusion  Airway Management Planned:   Additional Equipment:   Intra-op Plan:   Post-operative Plan:   Informed Consent: I have reviewed the patients History and Physical, chart, labs and discussed the procedure including the risks, benefits and alternatives for the proposed anesthesia with the patient or authorized representative who has indicated his/her understanding and acceptance.     Dental advisory given  Plan Discussed with: Anesthesiologist and CRNA  Anesthesia Plan Comments:        Anesthesia Quick Evaluation

## 2019-01-19 NOTE — ED Notes (Signed)
ED TO INPATIENT HANDOFF REPORT  ED Nurse Name and Phone #: Lorrin Goodell [phone]  S Name/Age/Gender Dylan Duncan 63 y.o. male Room/Bed: 011C/011C  Code Status   Code Status: Full Code  Home/SNF/Other Home Patient oriented to: self, place, time and situation Is this baseline? Yes   Triage Complete: Triage complete  Chief Complaint acid reflux and sob  Triage Note Pt presents w/headache and acid reflux x1 week, states he "fell out" 2 weeks ago.   Pt now w/multiple complaints. reports SOB, feeling weak, problems with bowel and bladder for months    Allergies Allergies  Allergen Reactions  . Lactose Intolerance (Gi) Other (See Comments)    Throws up  . Penicillins Itching    Level of Care/Admitting Diagnosis ED Disposition    ED Disposition Condition [facility]: [place] [[id number]]  Level of Care: Telemetry Medical [104]  Covid Evaluation: Asymptomatic Screening Protocol (No Symptoms)  Diagnosis: GI bleed [147829]  Admitting Physician: Assunta Found [[id number]]  Attending Physician: Assunta Found [[id number]]  Estimated length of stay: 3 - 4 days  Certification:: I certify this patient will need inpatient services for at least 2 midnights  PT Class (Do Not Modify): Inpatient [101]  PT Acc Code (Do Not Modify): Private [1]       B Medical/Surgery History Past Medical History:  Diagnosis Date  . Acid reflux   . Allergy   . Asthma   . Cataract   . COPD (chronic obstructive pulmonary disease) ([place])    ?Uses Adviar?  . Emphysema of lung ([place])   . GERD (gastroesophageal reflux disease)   . Hepatic steatosis   . 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 Liver fibrosis    fibrotest F4,elastography F2-F3  . Post traumatic stress disorder (PTSD)   . Substance abuse [facility])    Past Surgical History:  Procedure Laterality Date  . arm surgery Right   .  ESOPHAGOGASTRODUODENOSCOPY N/A 06/27/2012   Procedure: ESOPHAGOGASTRODUODENOSCOPY (EGD);  Surgeon: Jeryl Columbia, MD;  Location: [facility] ENDOSCOPY;  Service: Endoscopy;  Laterality: N/A;  . MANDIBLE SURGERY       A IV Location/Drains/Wounds Patient Lines/Drains/Airways Status   Active Line/Drains/Airways    Name:   Placement date:   Placement time:   Site:   Days:   Peripheral IV 01/18/19 Right Forearm   01/18/19    1731    Forearm   1          Intake/Output Last 24 hours  Intake/Output Summary (Last 24 hours) at 01/19/2019 1410 Last data filed at 01/19/2019 1017 Gross per 24 hour  Intake 2260 ml  Output 300 ml  Net 1960 ml    Labs/Imaging Results for orders placed or performed during the hospital encounter of 01/18/19 (from the past 48 hour(s))  Basic metabolic panel     Status: Abnormal   Collection Time: 01/18/19  2:08 PM  Result Value Ref Range   Sodium 137 135 - 145 mmol/L   Potassium 3.6 3.5 - 5.1 mmol/L   Chloride 103 98 - 111 mmol/L   CO2 21 (L) 22 - 32 mmol/L   Glucose, Bld 89 70 - 99 mg/dL   BUN 9 8 - 23 mg/dL   Creatinine, Ser 0.94 0.61 - 1.24 mg/dL   Calcium 8.9 8.9 - 10.3 mg/dL   GFR calc non Af Amer >60 >60 mL/min  GFR calc Af Amer >60 >60 mL/min   Anion gap 13 5 - 15    Comment: Performed at [facility] [address] [place], [place] [address]  CBC     Status: Abnormal   Collection Time: 01/18/19  2:08 PM  Result Value Ref Range   WBC 7.0 4.0 - 10.5 K/uL   RBC 2.08 (L) 4.22 - 5.81 MIL/uL   Hemoglobin 3.5 (LL) 13.0 - 17.0 g/dL    Comment: REPEATED TO VERIFY Reticulocyte Hemoglobin testing may be clinically indicated, consider ordering this additional test [id number] THIS CRITICAL RESULT HAS VERIFIED AND BEEN CALLED TO C CARLAN,RN BY DENNIS BRADLEY ON 11 03 2020 AT 1509, AND HAS BEEN READ BACK.     HCT 13.3 (L) 39.0 - 52.0 %   MCV 63.9 (L) 80.0 - 100.0 fL   MCH 16.8 (L) 26.0 - 34.0 pg   MCHC 26.3 (L) 30.0 - 36.0 g/dL   RDW 18.5 (H) 11.5 - 15.5  %   Platelets 394 150 - 400 K/uL   nRBC 0.7 (H) 0.0 - 0.2 %    Comment: Performed at [facility] [address], [place], [place] [address]  Prepare RBC     Status: None   Collection Time: 01/18/19  4:25 PM  Result Value Ref Range   Order Confirmation      ORDER PROCESSED BY BLOOD BANK Performed at [facility], [place] [address], [place], [place] [address]   Type and screen [place]     Status: None (Preliminary result)   Collection Time: 01/18/19  4:40 PM  Result Value Ref Range   ABO/RH(D) O POS    Antibody Screen NEG    Sample Expiration 01/21/2019,2359    Unit Number [id number]    Blood Component Type RED CELLS,LR    Unit division 00    Status of Unit ISSUED,FINAL    Transfusion Status OK TO TRANSFUSE    Crossmatch Result Compatible    Unit Number [id number]    Blood Component Type RED CELLS,LR    Unit division 00    Status of Unit ISSUED,FINAL    Transfusion Status OK TO TRANSFUSE    Crossmatch Result Compatible    Unit Number [id number]    Blood Component Type RED CELLS,LR    Unit division 00    Status of Unit ISSUED    Transfusion Status OK TO TRANSFUSE    Crossmatch Result Compatible    Unit Number [id number]    Blood Component Type RED CELLS,LR    Unit division 00    Status of Unit ISSUED    Transfusion Status OK TO TRANSFUSE    Crossmatch Result      Compatible Performed at [facility], [place] [address] [place], [place] [address]   Protime-INR     Status: None   Collection Time: 01/18/19  4:50 PM  Result Value Ref Range   Prothrombin Time 15.0 11.4 - 15.2 seconds   INR 1.2 0.8 - 1.2    Comment: (NOTE) INR goal varies based on device and disease states. Performed at [facility], [place] [address] [place], [place] [address]   I-stat chem 8, ED (not at [facility] or [facility])     Status: Abnormal   Collection Time: 01/18/19  4:58 PM  Result Value Ref Range   Sodium 136 135 - 145 mmol/L   Potassium 3.9 3.5 -  5.1 mmol/L   Chloride 102  98 - 111 mmol/L   BUN 10 8 - 23 mg/dL   Creatinine, Ser 0.90 0.61 - 1.24 mg/dL   Glucose, Bld 92 70 - 99 mg/dL   Calcium, Ion 1.14 (L) 1.15 - 1.40 mmol/L   TCO2 22 22 - 32 mmol/L   Hemoglobin 5.1 (LL) 13.0 - 17.0 g/dL   HCT 15.0 (L) 39.0 - 52.0 %   Comment NOTIFIED PHYSICIAN   POC occult blood, ED Provider will collect     Status: None   Collection Time: 01/18/19  6:48 PM  Result Value Ref Range   Fecal Occult Bld NEGATIVE NEGATIVE  SARS CORONAVIRUS 2 (TAT 6-24 HRS) Nasopharyngeal Nasopharyngeal Swab     Status: None   Collection Time: 01/18/19  7:25 PM   Specimen: Nasopharyngeal Swab  Result Value Ref Range   SARS Coronavirus 2 NEGATIVE NEGATIVE    Comment: (NOTE) SARS-CoV-2 target nucleic acids are NOT DETECTED. The SARS-CoV-2 RNA is generally detectable in upper and lower respiratory specimens during the acute phase of infection. Negative results do not preclude SARS-CoV-2 infection, do not rule out co-infections with other pathogens, and should not be used as the sole basis for treatment or other patient management decisions. Negative results must be combined with clinical observations, patient history, and epidemiological information. The expected result is Negative. Fact Sheet for Patients: SugarRoll.be Fact Sheet for Healthcare Providers: https://www.woods-mathews.com/ This test is not yet approved or cleared by the [place] FDA and  has been authorized for detection and/or diagnosis of SARS-CoV-2 by FDA under an Emergency Use Authorization (EUA). This EUA will remain  in effect (meaning this test can be used) for the duration of the COVID-19 declaration under Section 56 4(b)(1) of the Act, 21 U.S.C. section 360bbb-3(b)(1), unless the authorization is terminated or revoked sooner. Performed at [facility], [place] [address], [place], [place] [address]   Prepare RBC     Status: None    Collection Time: 01/18/19  8:16 PM  Result Value Ref Range   Order Confirmation      ORDER PROCESSED BY BLOOD BANK BB SAMPLE OR UNITS ALREADY AVAILABLE Performed at [facility], [place] [address], [place], [place] [address]   TSH     Status: None   Collection Time: 01/18/19  8:58 PM  Result Value Ref Range   TSH 1.590 0.350 - 4.500 uIU/mL    Comment: Performed by a 3rd Generation assay with a functional sensitivity of <=0.01 uIU/mL. Performed at [facility], [place] [address] [place], [place] [address]   HIV Antibody (routine testing w rflx)     Status: None   Collection Time: 01/18/19  9:04 PM  Result Value Ref Range   HIV Screen 4th Generation wRfx NON REACTIVE NON REACTIVE    Comment: Performed at [facility], [place] [address] [place], [place] [address]  Magnesium     Status: None   Collection Time: 01/18/19  9:04 PM  Result Value Ref Range   Magnesium 2.0 1.7 - 2.4 mg/dL    Comment: Performed at [facility], [place] [address], [place], [place] [address]  Phosphorus     Status: None   Collection Time: 01/18/19  9:04 PM  Result Value Ref Range   Phosphorus 3.5 2.5 - 4.6 mg/dL    Comment: Performed at [facility], [place] [address] [place], [place] [address]  Iron and TIBC     Status: Abnormal   Collection Time: 01/18/19  9:04 PM  Result Value Ref Range   Iron 17 (L) 45 - 182 ug/dL   TIBC 528 (H) 250 - 450 ug/dL   Saturation Ratios 3 (L) 17.9 - 39.5 %   UIBC 511 ug/dL    Comment: Performed at [facility] [address], [place], [place] [address]  Ferritin     Status: Abnormal   Collection Time: 01/18/19  9:04 PM  Result Value Ref Range   Ferritin 4 (L) 24 - 336 ng/mL    Comment: Performed at [facility], [place] [address], [place], [place] [address]  Vitamin B12     Status: None   Collection Time: 01/18/19  9:04 PM  Result Value Ref Range   Vitamin B-12 287 180 - 914 pg/mL    Comment: (NOTE) This assay is not validated for testing  neonatal or myeloproliferative syndrome specimens for Vitamin B12 levels. Performed at [facility], [place] [address], [place], [place] [address]   Troponin I (High Sensitivity)     Status: None   Collection Time: 01/19/19 12:41 AM  Result Value Ref Range   Troponin I (High Sensitivity) 5 <18 ng/L    Comment: (NOTE) Elevated high sensitivity troponin I (hsTnI) values and significant  changes across serial measurements may suggest ACS but many other  chronic and acute conditions are known to elevate hsTnI results.  Refer to the "Links" section for chest pain algorithms and additional  guidance. Performed at [facility], [place] [address], [place], [place] [address]   Comprehensive metabolic panel     Status: Abnormal   Collection Time: 01/19/19  1:28 AM  Result Value Ref Range   Sodium 136 135 - 145 mmol/L   Potassium 3.8 3.5 - 5.1 mmol/L   Chloride 107 98 - 111 mmol/L   CO2 19 (L) 22 - 32 mmol/L   Glucose, Bld 99 70 - 99 mg/dL   BUN 10 8 - 23 mg/dL   Creatinine, Ser 0.86 0.61 - 1.24 mg/dL   Calcium 9.0 8.9 - 10.3 mg/dL   Total Protein 6.7 6.5 - 8.1 g/dL   Albumin 3.4 (L) 3.5 - 5.0 g/dL   AST 16 15 - 41 U/L   ALT 9 0 - 44 U/L   Alkaline Phosphatase 43 38 - 126 U/L   Total Bilirubin 1.0 0.3 - 1.2 mg/dL   GFR calc non Af Amer >60 >60 mL/min   GFR calc Af Amer >60 >60 mL/min   Anion gap 10 5 - 15    Comment: Performed at [facility] [address], [place], [place] [address]  APTT     Status: None   Collection Time: 01/19/19  1:28 AM  Result Value Ref Range   aPTT 27 24 - 36 seconds    Comment: Performed at [facility] [address] [place] [address] [place], [place] [address]  TSH     Status: None   Collection Time: 01/19/19  1:28 AM  Result Value Ref Range   TSH 1.512 0.350 - 4.500 uIU/mL    Comment: Performed by a 3rd Generation assay with a functional sensitivity of <=0.01 uIU/mL. Performed at [facility], [place] [address] [place], [place] [address]    Hemoglobin and hematocrit, blood     Status: Abnormal   Collection Time: 01/19/19  1:54 AM  Result Value Ref Range   Hemoglobin 6.7 (LL) 13.0 - 17.0 g/dL    Comment: REPEATED TO VERIFY  POST TRANSFUSION SPECIMEN THIS CRITICAL RESULT HAS VERIFIED AND BEEN CALLED TO RN STEVEN TOWN BY MESSAN Lyman ON 11 04 2020 AT 0211, AND HAS BEEN READ BACK.     HCT 22.3 (L) 39.0 - 52.0 %    Comment: Performed at [facility], [place] [address], [place], [place] [address]  Troponin I (High Sensitivity)     Status: None   Collection Time: 01/19/19  3:36 AM  Result Value Ref Range   Troponin I (High Sensitivity) 6 <18 ng/L    Comment: (NOTE) Elevated high sensitivity troponin I (hsTnI) values and significant  changes across serial measurements may suggest ACS but many other  chronic and acute conditions are known to elevate hsTnI results.  Refer to the "Links" section for chest pain algorithms and additional  guidance. Performed at [facility], [place] [address], [place], [place] [address]   Hepatic function panel     Status: Abnormal   Collection Time: 01/19/19  3:36 AM  Result Value Ref Range   Total Protein 6.9 6.5 - 8.1 g/dL   Albumin 3.5 3.5 - 5.0 g/dL   AST 17 15 - 41 U/L   ALT 10 0 - 44 U/L   Alkaline Phosphatase 44 38 - 126 U/L   Total Bilirubin 1.4 (H) 0.3 - 1.2 mg/dL   Bilirubin, Direct 0.2 0.0 - 0.2 mg/dL   Indirect Bilirubin 1.2 (H) 0.3 - 0.9 mg/dL    Comment: Performed at [facility] [address], [place], [place] [address]  Prepare RBC     Status: None   Collection Time: 01/19/19  8:30 AM  Result Value Ref Range   Order Confirmation      ORDER PROCESSED BY BLOOD BANK Performed at [facility], [place] [address], [place], [place] [address]   Hemoglobin and hematocrit, blood     Status: Abnormal   Collection Time: 01/19/19 12:14 PM  Result Value Ref Range   Hemoglobin 9.0 (L) 13.0 - 17.0 g/dL    Comment: REPEATED TO VERIFY POST TRANSFUSION SPECIMEN     HCT 28.8 (L) 39.0 - 52.0 %    Comment: Performed at [facility] [address] [place], [place] [address]   Dg Chest 2 View  Result Date: 01/18/2019 CLINICAL DATA:  Shortness of breath, weakness. EXAM: CHEST - 2 VIEW COMPARISON:  04/06/2017. FINDINGS: Trachea is midline. Heart size normal. Biapical pleuroparenchymal scarring and bullous lesions. Lungs are hyperexpanded but otherwise clear. No pleural fluid. IMPRESSION: Hyperinflation without acute finding. Electronically Signed   By: Lorin Picket M.D.   On: 01/18/2019 14:35    Pending Labs Unresulted Labs (From admission, onward)    Start     Ordered   01/18/19 2058  Folate RBC  Once,   STAT     01/18/19 2057          Vitals/Pain Today's Vitals   01/19/19 1130 01/19/19 1209 01/19/19 1300 01/19/19 1400  BP: 133/74 132/70 138/74 (!) 141/95  Pulse: 77 69 66 69  Resp: (!) 22 15 18 18   Temp:      TempSrc:      SpO2: 99% 100% 97% 98%  PainSc:        Isolation Precautions No active isolations  Medications Medications  0.9 %  sodium chloride infusion (10 mL/hr Intravenous Not Given 01/19/19 0404)  sodium chloride flush (NS) 0.9 % injection 3 mL (3 mLs Intravenous Not Given 01/19/19 1025)  sodium chloride flush (  NS) 0.9 % injection 3 mL (has no administration in time range)  0.9 %  sodium chloride infusion (has no administration in time range)  0.9 %  sodium chloride infusion ( Intravenous New Bag/Given 01/18/19 2103)  pantoprazole (PROTONIX) injection 40 mg (40 mg Intravenous Given 01/19/19 1040)  albuterol (VENTOLIN HFA) 108 (90 Base) MCG/ACT inhaler 2 puff (2 puffs Inhalation Given 01/18/19 2132)  fluticasone furoate-vilanterol (BREO ELLIPTA) 200-25 MCG/INH 1 puff (1 puff Inhalation Given 01/19/19 1039)  0.9 %  sodium chloride infusion (Manually program via Guardrails IV Fluids) (has no administration in time range)  peg 3350 powder (MOVIPREP) kit 200 g (has no administration in time range)  pantoprazole (PROTONIX)  injection 40 mg (40 mg Intravenous Given 01/18/19 1901)    Mobility walks     Focused Assessments    R Recommendations: See Admitting Provider Note  Report given to:   Additional Notes:

## 2019-01-19 NOTE — ED Notes (Signed)
Clear liquid lunch tray ordered 

## 2019-01-19 NOTE — H&P (View-Only) (Signed)
 Referring Provider:    [facility]       Primary Care Physician:  Patient, No Pcp Per Primary Gastroenterologist:   Henry Danis, MD          Reason for Consultation:   Anemia, dark heme negative stools              ASSESSMENT /  PLAN    1. 62 yo male with history of treated HCV (responder), chronic liver disease ( ? Cirrhosis) presented with profound iron deficiency anemia.  Has had some black stools within last few weeks so seem like upper bleed BUT his BUN is normal, he is heme negative and has recently taken bismuth.  If upper bleed then rule out PUD, portal hypertensive gastropathy, varices.  -He is s/p 4 uPRBC with improvement in hgb from 3.5 to 6.7. -agree with bid PPI -Will need EGD but also consider colonoscopy. Keep NPO in case we elect to do EGD today.   2. Chronic liver disease.  US elastrography 2017 prior to HCV treatment showed Metavir fibrosis score is F2 + some F3, Moderate risk for fibrosis. HCV treated but has continued to drink so progression of liver disease is possible.    HPI:     Dylan Duncan is a 62 y.o. male with pmh significant for COPD, PTSD, GERD, cholelithiasis, chronic liver disease. Hx of HCV s/p treatment with negative follow up PCR.. Fibro-test prior to HCV treatment estimated grade 4 fibrosis. We saw patient one time in outpatient setting in early Jan 2019 . He had been referred by Hepatology clinic for anemia, colon cancer screening, weight loss and constipation. We arranged for EGD for GERD symptoms and also colonoscopy but neither study was done. He lives alone and couldn't mange getting procedures done.  Patient hasn't been seen since. Weight down 5 pounds since June per Epic  ED evaluation:   Dylan Duncan presented to ED yesterday with multiple complaints : headache, reflux, SIOB, weakness, weight loss. Reported recent black stools. H 'fell out" a few days ago.    Hgb 3.5, baseline ~ 12. MCV 63, platelets 394.  Heme negative BUN 10, Cr 0.86,  albumin 3.4, liver tests o/w normal. INR 1.2 TSH 1.5  Dylan Duncan takes acid reflux medicine as needed. He has been trying to manage chronic constipation with diet. He hasn't had any rectal bleeding. The dark stools started a couple of weeks ago but he did take some medication with Bismuth. No NSAID use. No abdominal pain. He has had some nausea / vomiting of non-bloody emesis.   Previous Endoscopies:   EGD 2014 for hematemesis Mild proximal gastropathy.    Past Medical History:  Diagnosis Date  . Acid reflux   . Allergy   . Asthma   . Cataract   . COPD (chronic obstructive pulmonary disease) (HCC)    ?Uses Adviar?  . Emphysema of lung (HCC)   . GERD (gastroesophageal reflux disease)   . Hepatic steatosis   . 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 Liver fibrosis    fibrotest F4,elastography F2-F3  . Post traumatic stress disorder (PTSD)   . Substance abuse (HCC)     Past Surgical History:  Procedure Laterality Date  . arm surgery Right   . ESOPHAGOGASTRODUODENOSCOPY N/A 06/27/2012   Procedure: ESOPHAGOGASTRODUODENOSCOPY (EGD);  Surgeon: Marc E Magod, MD;  Location: [facility];  Service: Endoscopy;  Laterality: N/A;  . MANDIBLE   SURGERY      Prior to Admission medications   Medication Sig Start Date End Date Taking? Authorizing Provider  albuterol (PROVENTIL HFA;VENTOLIN HFA) 108 (90 Base) MCG/ACT inhaler Inhale 2 puffs into the lungs every 4 (four) hours as needed for wheezing or shortness of breath. 04/06/17  Yes Pricilla LovelessGoldston, Scott, MD  Fluticasone-Salmeterol (ADVAIR DISKUS) 250-50 MCG/DOSE AEPB Inhale 1 puff into the lungs every 12 (twelve) hours. Patient taking differently: Inhale 1 puff into the lungs 2 (two) times daily as needed (Shortness of breath or wheezing).  04/06/17  Yes Pricilla LovelessGoldston, Scott, MD  oxyCODONE-acetaminophen (PERCOCET) 10-325 MG tablet Take 1 tablet by mouth every 8 (eight) hours as needed for pain. 09/03/18    [provider]  PEG-KCl-NaCl-NaSulf-Na Asc-C (PLENVU) 140 g SOLR Take 140 g by mouth as directed. Patient not taking: Reported on 01/18/2019 03/24/17   Sherrilyn Ristanis, Henry L III, MD    Current Facility-Administered Medications  Medication Dose Route Frequency Provider Last Rate Last Dose  . 0.9 %  sodium chloride infusion (Manually program via Guardrails IV Fluids)   Intravenous Once Vann, Jessica U, DO      . 0.9 %  sodium chloride infusion  10 mL/hr Intravenous Once Pfeiffer, Lebron ConnersMarcy, MD      . 0.9 %  sodium chloride infusion  250 mL Intravenous PRN Cristescu, Mircea G, MD      . 0.9 %  sodium chloride infusion   Intravenous Continuous Cristescu, Victorino SparrowMircea G, MD 75 mL/hr at 01/18/19 2103    . albuterol (VENTOLIN HFA) 108 (90 Base) MCG/ACT inhaler 2 puff  2 puff Inhalation Q4H PRN Cristescu, Victorino SparrowMircea G, MD   2 puff at 01/18/19 2132  . fluticasone furoate-vilanterol (BREO ELLIPTA) 200-25 MCG/INH 1 puff  1 puff Inhalation Daily Cristescu, Mircea G, MD      . pantoprazole (PROTONIX) injection 40 mg  40 mg Intravenous Q12H Cristescu, Mircea G, MD   40 mg at 01/19/19 0142  . sodium chloride flush (NS) 0.9 % injection 3 mL  3 mL Intravenous Q12H Cristescu, Victorino SparrowMircea G, MD   3 mL at 01/19/19 0137  . sodium chloride flush (NS) 0.9 % injection 3 mL  3 mL Intravenous PRN Cristescu, Victorino SparrowMircea G, MD       Current Outpatient Medications  Medication Sig Dispense Refill  . albuterol (PROVENTIL HFA;VENTOLIN HFA) 108 (90 Base) MCG/ACT inhaler Inhale 2 puffs into the lungs every 4 (four) hours as needed for wheezing or shortness of breath. 1 Inhaler 1  . Fluticasone-Salmeterol (ADVAIR DISKUS) 250-50 MCG/DOSE AEPB Inhale 1 puff into the lungs every 12 (twelve) hours. (Patient taking differently: Inhale 1 puff into the lungs 2 (two) times daily as needed (Shortness of breath or wheezing). ) 60 each 1  . oxyCODONE-acetaminophen (PERCOCET) 10-325 MG tablet Take 1 tablet by mouth every 8 (eight) hours as needed for pain.    Marland Kitchen.  PEG-KCl-NaCl-NaSulf-Na Asc-C (PLENVU) 140 g SOLR Take 140 g by mouth as directed. (Patient not taking: Reported on 01/18/2019) 1 each 0    Allergies as of 01/18/2019 - Review Complete 01/18/2019  Allergen Reaction Noted  . Lactose intolerance (gi) Other (See Comments) 05/11/2012  . Penicillins Itching 05/11/2012    Family History  Problem Relation Age of Onset  . Pneumonia Mother   . Heart disease Father        Hx of MI  . Colon cancer Neg Hx   . Esophageal cancer Neg Hx   . Liver cancer Neg Hx   . Pancreatic  cancer Neg Hx   . Rectal cancer Neg Hx   . Stomach cancer Neg Hx     Social History   Socioeconomic History  . Marital status: Single    Spouse name: Not on file  . Number of children: Not on file  . Years of education: Not on file  . Highest education level: Not on file  Occupational History  . Occupation: disabled    Comment: former Set designer  Social Needs  . Financial resource strain: Not on file  . Food insecurity    Worry: Not on file    Inability: Not on file  . Transportation needs    Medical: Not on file    Non-medical: Not on file  Tobacco Use  . Smoking status: Former Smoker    Packs/day: 0.14    Years: 4.00    Pack years: 0.56    Types: Cigarettes    Quit date: 03/17/2010    Years since quitting: 8.8  . Smokeless tobacco: Never Used  Substance and Sexual Activity  . Alcohol use: Yes    Alcohol/week: 2.0 standard drinks    Types: 2 Cans of beer per week    Comment: Daily.  . Drug use: Yes    Frequency: 2.0 times per week    Types: Marijuana  . Sexual activity: Yes    Birth control/protection: Condom  Lifestyle  . Physical activity    Days per week: Not on file    Minutes per session: Not on file  . Stress: Not on file  Relationships  . Social Musician on phone: Not on file    Gets together: Not on file    Attends religious service: Not on file    Active member of club or organization: Not on file    Attends meetings  of clubs or organizations: Not on file    Relationship status: Not on file  . Intimate partner violence    Fear of current or ex partner: Not on file    Emotionally abused: Not on file    Physically abused: Not on file    Forced sexual activity: Not on file  Other Topics Concern  . Not on file  Social History Narrative  . Not on file    Review of Systems: All systems reviewed and negative except where noted in HPI.  Physical Exam: Vital signs in last 24 hours: Temp:  [98.6 F (37 C)-99.8 F (37.7 C)] 99.2 F (37.3 C) (11/04 1019) Pulse Rate:  [62-101] 67 (11/04 1019) Resp:  [10-24] 14 (11/04 1019) BP: (110-155)/(56-89) 130/70 (11/04 1019) SpO2:  [97 %-100 %] 100 % (11/04 1019)   General:   Alert, thin black in NAD Psych:  Pleasant, cooperative. Normal mood and affect. Eyes:  Pupils equal, sclera clear, no icterus.   Conjunctiva pink. Ears:  Normal auditory acuity. Nose:  No deformity, discharge,  or lesions. Neck:  Supple; no masses Lungs:  Clear throughout to auscultation.   No wheezes, crackles, or rhonchi.  Heart:  Regular rate and rhythm; no murmurs, no lower extremity edema Abdomen:  Soft, non-distended, nontender, BS active, no palp mass   Rectal:  Deferred  Msk:  Symmetrical without gross deformities. . Neurologic:  Alert and  oriented x4;  grossly normal neurologically. Skin:  Intact without significant lesions or rashes.   Intake/Output from previous day: 11/03 0701 - 11/04 0700 In: 1945 [Blood:1945] Out: -  Intake/Output this shift: Total I/O In: 315 [Blood:315] Out: 300 [  Urine:300]  Lab Results: Recent Labs    01/18/19 1408 01/18/19 1658 01/19/19 0154  WBC 7.0  --   --   HGB 3.5* 5.1* 6.7*  HCT 13.3* 15.0* 22.3*  PLT 394  --   --    BMET Recent Labs    01/18/19 1408 01/18/19 1658 01/19/19 0128  NA 137 136 136  K 3.6 3.9 3.8  CL 103 102 107  CO2 21*  --  19*  GLUCOSE 89 92 99  BUN 9 10 10   CREATININE 0.94 0.90 0.86  CALCIUM 8.9  --   9.0   LFT Recent Labs    01/19/19 0336  PROT 6.9  ALBUMIN 3.5  AST 17  ALT 10  ALKPHOS 44  BILITOT 1.4*  BILIDIR 0.2  IBILI 1.2*   PT/INR Recent Labs    01/18/19 1650  LABPROT 15.0  INR 1.2   Hepatitis Panel No results for input(s): HEPBSAG, HCVAB, HEPAIGM, HEPBIGM in the last 72 hours.   . CBC Latest Ref Rng & Units 01/19/2019 01/18/2019 01/18/2019  WBC 4.0 - 10.5 K/uL - - 7.0  Hemoglobin 13.0 - 17.0 g/dL 6.7(LL) 5.1(LL) 3.5(LL)  Hematocrit 39.0 - 52.0 % 22.3(L) 15.0(L) 13.3(L)  Platelets 150 - 400 K/uL - - 394    . CMP Latest Ref Rng & Units 01/19/2019 01/19/2019 01/18/2019  Glucose 70 - 99 mg/dL - 99 92  BUN 8 - 23 mg/dL - 10 10  Creatinine 0.61 - 1.24 mg/dL - 0.86 0.90  Sodium 135 - 145 mmol/L - 136 136  Potassium 3.5 - 5.1 mmol/L - 3.8 3.9  Chloride 98 - 111 mmol/L - 107 102  CO2 22 - 32 mmol/L - 19(L) -  Calcium 8.9 - 10.3 mg/dL - 9.0 -  Total Protein 6.5 - 8.1 g/dL 6.9 6.7 -  Total Bilirubin 0.3 - 1.2 mg/dL 1.4(H) 1.0 -  Alkaline Phos 38 - 126 U/L 44 43 -  AST 15 - 41 U/L 17 16 -  ALT 0 - 44 U/L 10 9 -   Studies/Results: Dg Chest 2 View  Result Date: 01/18/2019 CLINICAL DATA:  Shortness of breath, weakness. EXAM: CHEST - 2 VIEW COMPARISON:  04/06/2017. FINDINGS: Trachea is midline. Heart size normal. Biapical pleuroparenchymal scarring and bullous lesions. Lungs are hyperexpanded but otherwise clear. No pleural fluid. IMPRESSION: Hyperinflation without acute finding. Electronically Signed   By: Lorin Picket M.D.   On: 01/18/2019 14:35    Active Problems:   Severe anemia   GI bleed    Tye Savoy, NP-C @  01/19/2019, 10:32 AM

## 2019-01-19 NOTE — Progress Notes (Signed)
Progress Note    Dylan Duncan  HID:[id number] DOB: 12/11/1955  DOA: 01/18/2019 PCP: Patient, No Pcp Per    Brief Narrative:     Medical records reviewed and are as summarized below:  Dylan Duncan is an 63 y.o. male with medical history significant of hepatitis C, chronic alcohol, COPD,GERD came with a chief complaint shortness of breath lightheadedness generalized weakness.  Patient states had an episode of lightheadedness almost near syncopal episode yesterday.  Pain he denies any recent vomiting.  Patient states head few days ago some black stools.  He drinks 2 beers a day. In the emergency room he had a hemoglobin of 3.5   Assessment/Plan:   Active Problems:   Severe anemia   GI bleed   Severe anemia secondary to GI bleed -Hemoccult was negative test done by emergency room physician Patient states had some black stools in the last few days Hemoglobin 3.5--> s/p 4 units-- repeat h/h pending -consult GI for upper endoscopy and colonoscopy  Near syncopal episode Secondary to severe anemia  History of hepatitis C Patient states had treatment for that does not remember where and when  alcohol abuse Drinks 2 beers a day  History of COPD Resume inhaler    Family Communication/Anticipated D/C date and plan/Code Status   DVT prophylaxis: scd Code Status: Full Code.  Family Communication:  Disposition Plan:    Medical Consultants:    gi     Subjective:     Objective:    Vitals:   01/19/19 1130 01/19/19 1209 01/19/19 1300 01/19/19 1400  BP: 133/74 132/70 138/74 (!) 141/95  Pulse: 77 69 66 69  Resp: (!) 22 15 18 18   Temp:      TempSrc:      SpO2: 99% 100% 97% 98%    Intake/Output Summary (Last 24 hours) at 01/19/2019 1435 Last data filed at 01/19/2019 1017 Gross per 24 hour  Intake 2260 ml  Output 300 ml  Net 1960 ml   There were no vitals filed for this visit.  Exam: In bed, NAD rrr No increased work of breathing A+OX3   Data Reviewed:   I have personally reviewed following labs and imaging studies:  Labs: Labs show the following:   Basic Metabolic Panel: Recent Labs  Lab 01/18/19 1408 01/18/19 1658 01/18/19 2104 01/19/19 0128  NA 137 136  --  136  K 3.6 3.9  --  3.8  CL 103 102  --  107  CO2 21*  --   --  19*  GLUCOSE 89 92  --  99  BUN 9 10  --  10  CREATININE 0.94 0.90  --  0.86  CALCIUM 8.9  --   --  9.0  MG  --   --  2.0  --   PHOS  --   --  3.5  --    GFR CrCl cannot be calculated (Unknown ideal weight.). Liver Function Tests: Recent Labs  Lab 01/19/19 0128 01/19/19 0336  AST 16 17  ALT 9 10  ALKPHOS 43 44  BILITOT 1.0 1.4*  PROT 6.7 6.9  ALBUMIN 3.4* 3.5   No results for input(s): LIPASE, AMYLASE in the last 168 hours. No results for input(s): AMMONIA in the last 168 hours. Coagulation profile Recent Labs  Lab 01/18/19 1650  INR 1.2    CBC: Recent Labs  Lab 01/18/19 1408 01/18/19 1658 01/19/19 0154 01/19/19 1214  WBC 7.0  --   --   --  HGB 3.5* 5.1* 6.7* 9.0*  HCT 13.3* 15.0* 22.3* 28.8*  MCV 63.9*  --   --   --   PLT 394  --   --   --    Cardiac Enzymes: No results for input(s): CKTOTAL, CKMB, CKMBINDEX, TROPONINI in the last 168 hours. BNP (last 3 results) No results for input(s): PROBNP in the last 8760 hours. CBG: No results for input(s): GLUCAP in the last 168 hours. D-Dimer: No results for input(s): DDIMER in the last 72 hours. Hgb A1c: No results for input(s): HGBA1C in the last 72 hours. Lipid Profile: No results for input(s): CHOL, HDL, LDLCALC, TRIG, CHOLHDL, LDLDIRECT in the last 72 hours. Thyroid function studies: Recent Labs    01/19/19 0128  TSH 1.512   Anemia work up: Recent Labs    01/18/19 2104  VITAMINB12 287  FERRITIN 4*  TIBC 528*  IRON 17*   Sepsis Labs: Recent Labs  Lab 01/18/19 1408  WBC 7.0    Microbiology Recent Results (from the past 240 hour(s))  SARS CORONAVIRUS 2 (TAT 6-24 HRS) Nasopharyngeal  Nasopharyngeal Swab     Status: None   Collection Time: 01/18/19  7:25 PM   Specimen: Nasopharyngeal Swab  Result Value Ref Range Status   SARS Coronavirus 2 NEGATIVE NEGATIVE Final    Comment: (NOTE) SARS-CoV-2 target nucleic acids are NOT DETECTED. The SARS-CoV-2 RNA is generally detectable in upper and lower respiratory specimens during the acute phase of infection. Negative results do not preclude SARS-CoV-2 infection, do not rule out co-infections with other pathogens, and should not be used as the sole basis for treatment or other patient management decisions. Negative results must be combined with clinical observations, patient history, and epidemiological information. The expected result is Negative. Fact Sheet for Patients: SugarRoll.be Fact Sheet for Healthcare Providers: https://www.woods-mathews.com/ This test is not yet approved or cleared by the [place] FDA and  has been authorized for detection and/or diagnosis of SARS-CoV-2 by FDA under an Emergency Use Authorization (EUA). This EUA will remain  in effect (meaning this test can be used) for the duration of the COVID-19 declaration under Section 56 4(b)(1) of the Act, 21 U.S.C. section 360bbb-3(b)(1), unless the authorization is terminated or revoked sooner. Performed at [facility], [place] [address], [place], [place] [address]     Procedures and diagnostic studies:  Dg Chest 2 View  Result Date: 01/18/2019 CLINICAL DATA:  Shortness of breath, weakness. EXAM: CHEST - 2 VIEW COMPARISON:  04/06/2017. FINDINGS: Trachea is midline. Heart size normal. Biapical pleuroparenchymal scarring and bullous lesions. Lungs are hyperexpanded but otherwise clear. No pleural fluid. IMPRESSION: Hyperinflation without acute finding. Electronically Signed   By: Lorin Picket M.D.   On: 01/18/2019 14:35    Medications:   . sodium chloride   Intravenous Once  . fluticasone  furoate-vilanterol  1 puff Inhalation Daily  . pantoprazole (PROTONIX) IV  40 mg Intravenous Q12H  . peg 3350 powder  1 kit Oral Once  . sodium chloride flush  3 mL Intravenous Q12H   Continuous Infusions: . sodium chloride    . sodium chloride    . sodium chloride 75 mL/hr at 01/18/19 2103     LOS: 1 day   Geradine Girt  [facility]   How to contact the [facility] Attending or Consulting provider [place] or covering provider during after hours [place], for this patient?  1. Check the care team in [facility] and look for a) attending/consulting [facility]  provider listed and b) the [facility] team listed 2. Log into [email or web address] and use [place]'s universal password to access. If you do not have the password, please contact the hospital operator. 3. Locate the [facility] provider you are looking for under [facility] and page to a number that you can be directly reached. 4. If you still have difficulty reaching the provider, please page the [facility] (Director on Call) for the Hospitalists listed on amion for assistance.  01/19/2019, 2:35 PM

## 2019-01-19 NOTE — ED Notes (Addendum)
Pt's sister, Rise Paganini, called for updates. Would like to be updated when pt gets upstairs

## 2019-01-20 ENCOUNTER — Inpatient Hospital Stay (HOSPITAL_COMMUNITY): Payer: Medicaid Other | Admitting: Certified Registered"

## 2019-01-20 ENCOUNTER — Encounter (HOSPITAL_COMMUNITY): Payer: Self-pay

## 2019-01-20 ENCOUNTER — Encounter (HOSPITAL_COMMUNITY)
Admission: EM | Discharge status: Against Medical Advice | Payer: Self-pay | Source: Home / Self Care | Attending: Internal Medicine

## 2019-01-20 DIAGNOSIS — K2980 Duodenitis without bleeding: Secondary | ICD-10-CM

## 2019-01-20 DIAGNOSIS — K317 Polyp of stomach and duodenum: Secondary | ICD-10-CM

## 2019-01-20 DIAGNOSIS — K3189 Other diseases of stomach and duodenum: Secondary | ICD-10-CM

## 2019-01-20 HISTORY — PX: ESOPHAGOGASTRODUODENOSCOPY (EGD) WITH PROPOFOL: SHX5813

## 2019-01-20 HISTORY — PX: BIOPSY: SHX5522

## 2019-01-20 LAB — BPAM RBC
Blood Product Expiration Date: 202012052359
Blood Product Expiration Date: 202012052359
Blood Product Expiration Date: 202012052359
Blood Product Expiration Date: 202012052359
ISSUE DATE / TIME: 202011031752
ISSUE DATE / TIME: 202011032134
ISSUE DATE / TIME: 202011040033
ISSUE DATE / TIME: 202011040815
Unit Type and Rh: 5100
Unit Type and Rh: 5100
Unit Type and Rh: 5100
Unit Type and Rh: 5100

## 2019-01-20 LAB — CBC
HCT: 34.2 % — ABNORMAL LOW (ref 39.0–52.0)
Hemoglobin: 10.5 g/dL — ABNORMAL LOW (ref 13.0–17.0)
MCH: 23.9 pg — ABNORMAL LOW (ref 26.0–34.0)
MCHC: 30.7 g/dL (ref 30.0–36.0)
MCV: 77.7 fL — ABNORMAL LOW (ref 80.0–100.0)
Platelets: 364 10*3/uL (ref 150–400)
RBC: 4.4 MIL/uL (ref 4.22–5.81)
WBC: 5.9 10*3/uL (ref 4.0–10.5)
nRBC: 0 % (ref 0.0–0.2)

## 2019-01-20 LAB — TYPE AND SCREEN
ABO/RH(D): O POS
Antibody Screen: NEGATIVE
Unit division: 0
Unit division: 0
Unit division: 0
Unit division: 0

## 2019-01-20 SURGERY — ESOPHAGOGASTRODUODENOSCOPY (EGD) WITH PROPOFOL
Anesthesia: Monitor Anesthesia Care

## 2019-01-20 MED ORDER — ADULT MULTIVITAMIN W/MINERALS CH
1.0000 | ORAL_TABLET | Freq: Every day | ORAL | Status: DC
  Administered 2019-01-20: 1 via ORAL
  Filled 2019-01-20: qty 1

## 2019-01-20 MED ORDER — LACTATED RINGERS IV SOLN
INTRAVENOUS | Status: DC | PRN
  Administered 2019-01-20: 10:00:00 via INTRAVENOUS

## 2019-01-20 MED ORDER — PEG 3350-KCL-NABCB-NACL-NASULF 236 G PO SOLR
2000.0000 mL | Freq: Once | ORAL | Status: AC
  Administered 2019-01-20: 2000 mL via ORAL
  Filled 2019-01-20 (×2): qty 4000

## 2019-01-20 MED ORDER — POLYETHYLENE GLYCOL 3350 17 GM/SCOOP PO POWD: 1.0000 | Freq: Once | ORAL | Status: DC

## 2019-01-20 MED ORDER — PHENOL 1.4 % MT LIQD
1.0000 | OROMUCOSAL | Status: DC | PRN
  Filled 2019-01-20 (×2): qty 177

## 2019-01-20 MED ORDER — METOCLOPRAMIDE HCL 5 MG/ML IJ SOLN: 10.0000 mg | Freq: Once | INTRAMUSCULAR | Status: DC

## 2019-01-20 MED ORDER — METOCLOPRAMIDE HCL 5 MG/ML IJ SOLN
10.0000 mg | Freq: Once | INTRAMUSCULAR | Status: AC
  Administered 2019-01-20: 10 mg via INTRAVENOUS
  Filled 2019-01-20: qty 2

## 2019-01-20 MED ORDER — BISACODYL 5 MG PO TBEC
20.0000 mg | DELAYED_RELEASE_TABLET | Freq: Once | ORAL | Status: AC
  Administered 2019-01-20: 20 mg via ORAL
  Filled 2019-01-20: qty 4

## 2019-01-20 MED ORDER — PEG 3350-KCL-NABCB-NACL-NASULF 236 G PO SOLR
2000.0000 mL | Freq: Once | ORAL | Status: DC
  Filled 2019-01-20: qty 4000

## 2019-01-20 MED ORDER — PROPOFOL 10 MG/ML IV BOLUS
INTRAVENOUS | Status: DC | PRN
  Administered 2019-01-20 (×2): 20 mg via INTRAVENOUS

## 2019-01-20 MED ORDER — LIDOCAINE 2% (20 MG/ML) 5 ML SYRINGE
INTRAMUSCULAR | Status: DC | PRN
  Administered 2019-01-20: 60 mg via INTRAVENOUS

## 2019-01-20 MED ORDER — PROPOFOL 500 MG/50ML IV EMUL
INTRAVENOUS | Status: DC | PRN
  Administered 2019-01-20: 200 ug/kg/min via INTRAVENOUS

## 2019-01-20 SURGICAL SUPPLY — 25 items

## 2019-01-20 NOTE — Progress Notes (Signed)
Chaplain responded to spiritual care consult for Advanced Directive.  Upon speaking with Mr. Dylan Duncan, he stated that he actually did not want an AD, but wanted help finding an apartment for when he leaves the hospital.  Chaplain noted that he would need to speak with case management or social work.  Chaplain will follow-up as needed.

## 2019-01-20 NOTE — Progress Notes (Signed)
Progress Note    Dylan Duncan  VVK:[id number] DOB: 13-May-1955  DOA: 01/18/2019 PCP: Patient, No Pcp Per    Brief Narrative:     Medical records reviewed and are as summarized below:  Dylan Duncan is an 63 y.o. male with medical history significant of hepatitis C, chronic alcohol, COPD,GERD came with a chief complaint shortness of breath lightheadedness generalized weakness.  Patient states had an episode of lightheadedness almost near syncopal episode yesterday.  Pain he denies any recent vomiting.  Patient states head few days ago some black stools.  He drinks 2 beers a day. In the emergency room he had a hemoglobin of 3.5.  S/p EGD now for colonoscopy in the AM  Assessment/Plan:   Active Problems:   Symptomatic anemia   GI bleed   Duodenitis   Severe anemia secondary to GI bleed -Hemoccult was negative test done by emergency room physician Patient states had some black stools in the last few days Hemoglobin 3.5--> s/p 4 units-- repeat h/h:9 -CBC this AM pending -consult GI  -upper endoscopy: no findings to support his severe anemia -colonoscopy planned for the AM  Near syncopal episode Secondary to severe anemia  History of hepatitis C Patient states had treatment for that does not remember where and when  alcohol abuse Drinks 2 beers a day -no sign of withdrawal  History of COPD Resume inhaler    Family Communication/Anticipated D/C date and plan/Code Status   DVT prophylaxis: scd Code Status: Full Code.  Family Communication:  Disposition Plan: if h/h stable and colonoscopy done in the AM   Medical Consultants:    gi     Subjective:   Not able to tolerate the prep for his colonoscopy overnight  Objective:    Vitals:   01/20/19 0306 01/20/19 0734 01/20/19 0917 01/20/19 1041  BP: 129/69 (!) 142/71 (!) 163/72 113/60  Pulse: 63 (!) 52 (!) 55 67  Resp: 17 18 19 18   Temp: 98 F (36.7 C) 98.3 F (36.8 C) 98.6 F (37 C) 98.8 F  (37.1 C)  TempSrc: Oral Oral Oral Oral  SpO2: 100% 100% 100% 100%  Weight: 53 kg     Height:        Intake/Output Summary (Last 24 hours) at 01/20/2019 1051 Last data filed at 01/20/2019 0645 Gross per 24 hour  Intake 2639.77 ml  Output 2000 ml  Net 639.77 ml   Filed Weights   01/19/19 1443 01/20/19 0306  Weight: 53.5 kg 53 kg    Exam: In bed, NAD A+ox3 No increased work of breathing  Data Reviewed:   I have personally reviewed following labs and imaging studies:  Labs: Labs show the following:   Basic Metabolic Panel: Recent Labs  Lab 01/18/19 1408 01/18/19 1658 01/18/19 2104 01/19/19 0128  NA 137 136  --  136  K 3.6 3.9  --  3.8  CL 103 102  --  107  CO2 21*  --   --  19*  GLUCOSE 89 92  --  99  BUN 9 10  --  10  CREATININE 0.94 0.90  --  0.86  CALCIUM 8.9  --   --  9.0  MG  --   --  2.0  --   PHOS  --   --  3.5  --    GFR Estimated Creatinine Clearance: 65.9 mL/min (by C-G formula based on SCr of 0.86 mg/dL). Liver Function Tests: Recent Labs  Lab 01/19/19 0128 01/19/19  0336  AST 16 17  ALT 9 10  ALKPHOS 43 44  BILITOT 1.0 1.4*  PROT 6.7 6.9  ALBUMIN 3.4* 3.5   No results for input(s): LIPASE, AMYLASE in the last 168 hours. No results for input(s): AMMONIA in the last 168 hours. Coagulation profile Recent Labs  Lab 01/18/19 1650  INR 1.2    CBC: Recent Labs  Lab 01/18/19 1408 01/18/19 1658 01/19/19 0154 01/19/19 1214  WBC 7.0  --   --   --   HGB 3.5* 5.1* 6.7* 9.0*  HCT 13.3* 15.0* 22.3* 28.8*  MCV 63.9*  --   --   --   PLT 394  --   --   --    Cardiac Enzymes: No results for input(s): CKTOTAL, CKMB, CKMBINDEX, TROPONINI in the last 168 hours. BNP (last 3 results) No results for input(s): PROBNP in the last 8760 hours. CBG: No results for input(s): GLUCAP in the last 168 hours. D-Dimer: No results for input(s): DDIMER in the last 72 hours. Hgb A1c: No results for input(s): HGBA1C in the last 72 hours. Lipid Profile: No  results for input(s): CHOL, HDL, LDLCALC, TRIG, CHOLHDL, LDLDIRECT in the last 72 hours. Thyroid function studies: Recent Labs    01/19/19 0128  TSH 1.512   Anemia work up: Recent Labs    01/18/19 2104  VITAMINB12 287  FERRITIN 4*  TIBC 528*  IRON 17*   Sepsis Labs: Recent Labs  Lab 01/18/19 1408  WBC 7.0    Microbiology Recent Results (from the past 240 hour(s))  SARS CORONAVIRUS 2 (TAT 6-24 HRS) Nasopharyngeal Nasopharyngeal Swab     Status: None   Collection Time: 01/18/19  7:25 PM   Specimen: Nasopharyngeal Swab  Result Value Ref Range Status   SARS Coronavirus 2 NEGATIVE NEGATIVE Final    Comment: (NOTE) SARS-CoV-2 target nucleic acids are NOT DETECTED. The SARS-CoV-2 RNA is generally detectable in upper and lower respiratory specimens during the acute phase of infection. Negative results do not preclude SARS-CoV-2 infection, do not rule out co-infections with other pathogens, and should not be used as the sole basis for treatment or other patient management decisions. Negative results must be combined with clinical observations, patient history, and epidemiological information. The expected result is Negative. Fact Sheet for Patients: SugarRoll.be Fact Sheet for Healthcare Providers: https://www.woods-mathews.com/ This test is not yet approved or cleared by the [place] FDA and  has been authorized for detection and/or diagnosis of SARS-CoV-2 by FDA under an Emergency Use Authorization (EUA). This EUA will remain  in effect (meaning this test can be used) for the duration of the COVID-19 declaration under Section 56 4(b)(1) of the Act, 21 U.S.C. section 360bbb-3(b)(1), unless the authorization is terminated or revoked sooner. Performed at [facility], [place] [address], [place], [place] [address]     Procedures and diagnostic studies:  Dg Chest 2 View  Result Date: 01/18/2019 CLINICAL DATA:  Shortness  of breath, weakness. EXAM: CHEST - 2 VIEW COMPARISON:  04/06/2017. FINDINGS: Trachea is midline. Heart size normal. Biapical pleuroparenchymal scarring and bullous lesions. Lungs are hyperexpanded but otherwise clear. No pleural fluid. IMPRESSION: Hyperinflation without acute finding. Electronically Signed   By: Lorin Picket M.D.   On: 01/18/2019 14:35    Medications:   . [MAR Hold] sodium chloride   Intravenous Once  . bisacodyl  20 mg Oral Once  . [MAR Hold] fluticasone furoate-vilanterol  1 puff Inhalation Daily  . metoCLOPramide (REGLAN) injection  10 mg  Intravenous Once   Followed by  . metoCLOPramide (REGLAN) injection  10 mg Intravenous Once  . [MAR Hold] pantoprazole (PROTONIX) IV  40 mg Intravenous Q12H  . [MAR Hold] peg 3350 powder  1 kit Oral Once  . polyethylene glycol powder  1 Container Oral Once  . [MAR Hold] sodium chloride flush  3 mL Intravenous Q12H   Continuous Infusions: . [MAR Hold] sodium chloride    . [MAR Hold] sodium chloride    . sodium chloride 75 mL/hr at 01/20/19 0305     LOS: 2 days   Geradine Girt  [facility]   How to contact the [facility] Attending or Consulting provider [place] or covering provider during after hours [place], for this patient?  1. Check the care team in [facility] and look for a) attending/consulting [facility] provider listed and b) the [facility] team listed 2. Log into [email or web address] and use [place]'s universal password to access. If you do not have the password, please contact the hospital operator. 3. Locate the [facility] provider you are looking for under [facility] and page to a number that you can be directly reached. 4. If you still have difficulty reaching the provider, please page the [facility] (Director on Call) for the Hospitalists listed on amion for assistance.  01/20/2019, 10:51 AM

## 2019-01-20 NOTE — Interval H&P Note (Signed)
History and Physical Interval Note:  01/20/2019 10:24 AM  Dylan Duncan  has presented today for surgery, with the diagnosis of iron deficiency anemia.  The various methods of treatment have been discussed with the patient and family. After consideration of risks, benefits and other options for treatment, the patient has consented to  Procedure(s): ESOPHAGOGASTRODUODENOSCOPY (EGD) WITH PROPOFOL (N/A) as a surgical intervention.  The patient's history has been reviewed, patient examined, no change in status, stable for surgery.  I have reviewed the patient's chart and labs.  Questions were answered to the patient's satisfaction.     Thornton Park

## 2019-01-20 NOTE — Anesthesia Postprocedure Evaluation (Signed)
Anesthesia Post Note  Patient: Dylan Duncan  Procedure(s) Performed: ESOPHAGOGASTRODUODENOSCOPY (EGD) WITH PROPOFOL (N/A ) BIOPSY     Patient location during evaluation: Endoscopy Anesthesia Type: MAC Level of consciousness: awake and alert Pain management: pain level controlled Vital Signs Assessment: post-procedure vital signs reviewed and stable Respiratory status: spontaneous breathing, nonlabored ventilation, respiratory function stable and patient connected to nasal cannula oxygen Cardiovascular status: blood pressure returned to baseline and stable Postop Assessment: no apparent nausea or vomiting Anesthetic complications: no    Last Vitals:  Vitals:   01/20/19 1041 01/20/19 1055  BP: 113/60 (!) 162/98  Pulse: 67 86  Resp: 18 (!) 22  Temp: 37.1 C   SpO2: 100% 100%    Last Pain:  Vitals:   01/20/19 1041  TempSrc: Oral  PainSc:                  Dylan Duncan

## 2019-01-20 NOTE — Op Note (Addendum)
[facility] Patient Name: Dylan Duncan Procedure Date : 01/20/2019 MRN: [id number] Attending MD: Tressia Danas MD, MD Date of Birth: 17-Apr-1955 CSN: [id number] Age: 63 Admit Type: Inpatient Procedure:                Upper GI endoscopy Indications:              Melena, Gastrointestinal bleeding of unknown origin Providers:                Tressia Danas MD, MD, Roslynn Amble, RN, Arlee Muslim Tech., Technician Referring MD:              Medicines:                Monitored Anesthesia Care Complications:            No immediate complications. Estimated blood loss:                            Minimal. Estimated Blood Loss:     Estimated blood loss was minimal. Procedure:                Pre-Anesthesia Assessment:                           - Prior to the procedure, a History and Physical                            was performed, and patient medications and                            allergies were reviewed. The patient's tolerance of                            previous anesthesia was also reviewed. The risks                            and benefits of the procedure and the sedation                            options and risks were discussed with the patient.                            All questions were answered, and informed consent                            was obtained. Prior Anticoagulants: The patient has                            taken no previous anticoagulant or antiplatelet                            agents. ASA Grade Assessment: III - A patient with  severe systemic disease. After reviewing the risks                            and benefits, the patient was deemed in                            satisfactory condition to undergo the procedure.                           After obtaining informed consent, the endoscope was                            passed under direct vision. Throughout the         procedure, the patient's blood pressure, pulse, and                            oxygen saturations were monitored continuously. The                            GIF-[id number] ([id number] Olympus gastroscope was                            introduced through the mouth, and advanced to the                            third part of duodenum. The upper GI endoscopy was                            accomplished without difficulty. The patient                            tolerated the procedure well. Scope In: Scope Out: Findings:      The esophagus was normal. No esophageal varices.      There was mild gastritis noted in the fundus and proximal body. Biopsies       were taken from the antrum, body, and fundus with a cold forceps for       histology. Estimated blood loss was minimal. No portal hypertensive       gastropathy or gastric varices. A small hiatal hernia is present.      A single 3 mm sessile polyp with no bleeding and no stigmata of recent       bleeding was found in the gastric fundus. Biopsies were taken with a       cold forceps for histology. Estimated blood loss was minimal.      Patchy mildly erythematous mucosa without active bleeding and with no       stigmata of bleeding was found in the duodenal bulb. Biopsies were taken       with a cold forceps for histology. Estimated blood loss was minimal.      There may be some external compression on the 1st and 2nd portions of       the duodenum. The exam was otherwise without abnormality. Impression:               - Normal esophagus.                           -  Small hiatal hernia.                           - Mild gastritis. Biopsied.                           - A single gastric polyp. Biopsied.                           - Erythematous duodenopathy. Biopsied.                           - No evidence for portal hypertensive gastropathy                            or varices.                           - Possible external compression on the  1st and 2nd                            portions of the duodenum.                           - These findings do not explain his profound                            anemia. Unfortunately, he did not tolerate bowel                            prep last night. Will plan additional laxative this                            evening in preparation for colonoscopy 01/21/19. Recommendation:           - Return patient to hospital ward for ongoing care.                           - Clear liquid diet today.                           - Continue present medications with the addition of                            bowel prep as ordered.                           - Await pathology results.                           - Plan colonoscopy 01/21/19.                           - Consider cross-sectional imaging if colonoscopy                            is negative given the findings  suggested in the                            duodenum. Procedure Code(s):        --- Professional ---                           [phone], Esophagogastroduodenoscopy, flexible,                            transoral; with biopsy, single or multiple Diagnosis Code(s):        --- Professional ---                           K31.7, Polyp of stomach and duodenum                           K31.89, Other diseases of stomach and duodenum                           K92.1, Melena (includes Hematochezia)                           K92.2, Gastrointestinal hemorrhage, unspecified CPT copyright 2019 American Medical Association. All rights reserved. The codes documented in this report are preliminary and upon coder review may  be revised to meet current compliance requirements. Tressia Danas MD, MD 01/20/2019 10:46:17 AM This report has been signed electronically. Number of Addenda: 0

## 2019-01-20 NOTE — Transfer of Care (Signed)
Immediate Anesthesia Transfer of Care Note  Patient: Dylan Duncan  Procedure(s) Performed: ESOPHAGOGASTRODUODENOSCOPY (EGD) WITH PROPOFOL (N/A ) BIOPSY  Patient Location: Endoscopy Unit  Anesthesia Type:MAC  Level of Consciousness: awake, alert  and oriented  Airway & Oxygen Therapy: Patient Spontanous Breathing and Patient connected to nasal cannula oxygen  Post-op Assessment: Report given to RN, Post -op Vital signs reviewed and stable and Patient moving all extremities  Post vital signs: Reviewed and stable  Last Vitals:  Vitals Value Taken Time  BP 113/60 01/20/19 1041  Temp 37.1 C 01/20/19 1041  Pulse 73 01/20/19 1049  Resp 11 01/20/19 1051  SpO2 100 % 01/20/19 1049  Vitals shown include unvalidated device data.  Last Pain:  Vitals:   01/20/19 1041  TempSrc: Oral  PainSc:          Complications: No apparent anesthesia complications

## 2019-01-20 NOTE — Progress Notes (Signed)
RN asked if pt would drink any more bowel prep (just to confirm if he would drink 3am dose). Patient states "I doubt it", has not had a bowel movement. Provider notified.

## 2019-01-20 NOTE — Plan of Care (Signed)
  Problem: Education: Goal: Knowledge of General Education information will improve Description Including pain rating scale, medication(s)/side effects and non-pharmacologic comfort measures Outcome: Progressing   Problem: Health Behavior/Discharge Planning: Goal: Ability to manage health-related needs will improve Outcome: Progressing   

## 2019-01-20 NOTE — Progress Notes (Signed)
Initial Nutrition Assessment  DOCUMENTATION CODES:   Non-severe (moderate) malnutrition in context of chronic illness  INTERVENTION:  Multivitamin with minerals daily.  Once diet is advanced to full liquid, Ensure Enlive (each supplement provides 350 calories and 20 grams of protein)   NUTRITION DIAGNOSIS:   Moderate Malnutrition related to chronic illness(alcoholism, COPD) as evidenced by percent weight loss, moderate fat depletion, moderate muscle depletion.  14% wt loss in 6 months.  GOAL:   Patient will meet greater than or equal to 90% of their needs   MONITOR:   PO intake, Supplement acceptance, Diet advancement, Labs, Weight trends, I & O's  REASON FOR ASSESSMENT:   Other (Comment)(Low BMI)    ASSESSMENT:   63 yo M Adm for lightheadedness, hemoglobin of 3.5. PMH includes asthma, COPD, GERD, Hepatic Steatosis, Liver Fibrosis, Substance abuse. NPO.  Communicated with RN prior to assessment and noted that he was uninterested in drinking the bowel prep and this was confirmed by pt.   Pt was mildly agitated during visit with RD, gave answers to questions and permission to perform exam.   Dylan Duncan was eating jello when RD came to do assessment and had consumed broth and ice pops that had come to his room.  States that he had a good appetite PTA and that he is "ready to eat some real food." Explained that his diet PTA was inconsistent in regards to meal times and when he did eat, meals would typically consist of fried chicken or a vegetable plate at the soup kitchen. He seemed agitated that he was on a clear liquid diet and denies interest in clear liquid nutritional supplements, but expressed interest in ice cream or ensure when his diet is upgraded to full liquids.    Pt has lost 14% body weight in past 6 months according to documented weight history.  Endorsed that his UBW is approximately 135# which is consistent with his weight history which fluctuates +/- 5#.    Labs and medications reviewed.   NUTRITION - FOCUSED PHYSICAL EXAM:    Most Recent Value  Orbital Region  Mild depletion  Upper Arm Region  Severe depletion  Thoracic and Lumbar Region  Moderate depletion  Buccal Region  Moderate depletion  Temple Region  Moderate depletion  Clavicle Bone Region  Moderate depletion  Clavicle and Acromion Bone Region  Moderate depletion  Scapular Bone Region  Moderate depletion  Dorsal Hand  Moderate depletion  Patellar Region  Moderate depletion  Anterior Thigh Region  Moderate depletion  Posterior Calf Region  Moderate depletion  Edema (RD Assessment)  None  Hair  Reviewed  Eyes  Reviewed  Mouth  Reviewed  Skin  Reviewed  Nails  Reviewed       Diet Order:   Diet Order            Diet clear liquid Room service appropriate? Yes; Fluid consistency: Thin  Diet effective now              EDUCATION NEEDS:   Education needs have been addressed  Skin:  Skin Assessment: Reviewed RN Assessment  Last BM:  unknown  Height:   Ht Readings from Last 1 Encounters:  01/19/19 6\' 2"  (1.88 m)    Weight:   Wt Readings from Last 1 Encounters:  01/20/19 53 kg    Ideal Body Weight:  83.6 kg  BMI:  Body mass index is 15.01 kg/m.  Estimated Nutritional Needs:   Kcal:  1800-2000kcal  Protein:  90-105 grams  Fluid:  >/= 1.8 L    Berneta Levins, Dietetic Intern

## 2019-01-21 ENCOUNTER — Inpatient Hospital Stay (HOSPITAL_COMMUNITY): Payer: Medicaid Other | Admitting: Certified Registered"

## 2019-01-21 ENCOUNTER — Encounter (HOSPITAL_COMMUNITY): Payer: Self-pay | Admitting: Gastroenterology

## 2019-01-21 ENCOUNTER — Encounter: Payer: Self-pay | Admitting: Gastroenterology

## 2019-01-21 ENCOUNTER — Encounter (HOSPITAL_COMMUNITY)
Admission: EM | Discharge status: Against Medical Advice | Payer: Self-pay | Source: Home / Self Care | Attending: Internal Medicine

## 2019-01-21 DIAGNOSIS — D649 Anemia, unspecified: Secondary | ICD-10-CM

## 2019-01-21 DIAGNOSIS — K635 Polyp of colon: Secondary | ICD-10-CM

## 2019-01-21 DIAGNOSIS — E440 Moderate protein-calorie malnutrition: Secondary | ICD-10-CM | POA: Insufficient documentation

## 2019-01-21 HISTORY — PX: GIVENS CAPSULE STUDY: SHX5432

## 2019-01-21 HISTORY — PX: BIOPSY: SHX5522

## 2019-01-21 HISTORY — PX: COLONOSCOPY WITH PROPOFOL: SHX5780

## 2019-01-21 LAB — FOLATE RBC
Folate, Hemolysate: 393 ng/mL
Folate, RBC: 1206 ng/mL (ref >498)
Hematocrit: 32.6 % — ABNORMAL LOW (ref 37.5–51.0)

## 2019-01-21 LAB — SURGICAL PATHOLOGY

## 2019-01-21 SURGERY — COLONOSCOPY WITH PROPOFOL
Anesthesia: Monitor Anesthesia Care

## 2019-01-21 MED ORDER — LORAZEPAM 1 MG PO TABS: 1.0000 mg | ORAL_TABLET | ORAL | Status: DC | PRN

## 2019-01-21 MED ORDER — LORAZEPAM 2 MG/ML IJ SOLN: 1.0000 mg | INTRAMUSCULAR | Status: DC | PRN

## 2019-01-21 MED ORDER — PROPOFOL 500 MG/50ML IV EMUL
INTRAVENOUS | Status: DC | PRN
  Administered 2019-01-21: 150 ug/kg/min via INTRAVENOUS

## 2019-01-21 MED ORDER — LACTATED RINGERS IV SOLN
INTRAVENOUS | Status: DC | PRN
  Administered 2019-01-21: 12:00:00 via INTRAVENOUS

## 2019-01-21 SURGICAL SUPPLY — 21 items

## 2019-01-21 NOTE — Interval H&P Note (Signed)
History and Physical Interval Note:  01/21/2019 12:08 PM  Dylan Duncan  has presented today for surgery, with the diagnosis of iron deficiency anemia.  The various methods of treatment have been discussed with the patient and family. After consideration of risks, benefits and other options for treatment, the patient has consented to  Procedure(s): COLONOSCOPY WITH PROPOFOL (N/A) as a surgical intervention.  The patient's history has been reviewed, patient examined, no change in status, stable for surgery.  I have reviewed the patient's chart and labs.  Questions were answered to the patient's satisfaction.     Thornton Park

## 2019-01-21 NOTE — Plan of Care (Signed)
  Problem: Education: Goal: Knowledge of General Education information will improve Description Including pain rating scale, medication(s)/side effects and non-pharmacologic comfort measures Outcome: Progressing   Problem: Health Behavior/Discharge Planning: Goal: Ability to manage health-related needs will improve Outcome: Progressing   

## 2019-01-21 NOTE — Progress Notes (Signed)
Stool is now clear/yellow with minimum solid pieces.

## 2019-01-21 NOTE — Progress Notes (Signed)
Patient is still drinking his first bottle of bowel prep, so he did not want the 2nd bottle due at 1am. Patient is going to the bathroom frequently and bowel movements are becoming clear.

## 2019-01-21 NOTE — Progress Notes (Signed)
Notified provider and incharge nurse, pt refused treatment and would like to go home, ama paper signed, take off iv,gauze cover secure with tape, wire off from pt, hand belonging to pt.walk out from room, refused wheelchair.

## 2019-01-21 NOTE — Op Note (Signed)
[facility] Patient Name: Dylan Duncan Procedure Date : 01/21/2019 MRN: [id number] Attending MD: Tressia DanasKimberly Tyjay Galindo MD, MD Date of Birth: 22-Jul-1955 CSN: [id number] Age: 63 Admit Type: Inpatient Procedure:                Colonoscopy Indications:              Unexplained iron deficiency anemia. No source                            identified on EGD. Providers:                Tressia DanasKimberly Marielena Harvell MD, MD, Vicki MalletBrandy Grace, RN, Beryle BeamsJanie                            [place], Technician [facility] Maness CRNA Referring MD:              Medicines:                Monitored Anesthesia Care Complications:            No immediate complications. Estimated blood loss:                            Minimal. Estimated Blood Loss:     Estimated blood loss was minimal. Procedure:                Pre-Anesthesia Assessment:                           - Prior to the procedure, a History and Physical                            was performed, and patient medications and                            allergies were reviewed. The patient's tolerance of                            previous anesthesia was also reviewed. The risks                            and benefits of the procedure and the sedation                            options and risks were discussed with the patient.                            All questions were answered, and informed consent                            was obtained. Prior Anticoagulants: The patient has                            taken no previous anticoagulant or antiplatelet                            agents. ASA  Grade Assessment: III - A patient with                            severe systemic disease. After reviewing the risks                            and benefits, the patient was deemed in                            satisfactory condition to undergo the procedure.                           After obtaining informed consent, the colonoscope                            was passed under  direct vision. Throughout the                            procedure, the patient's blood pressure, pulse, and                            oxygen saturations were monitored continuously. The                            CF-HQ190L ([id number] Olympus colonoscope was                            introduced through the anus and advanced to the the                            terminal ileum, with identification of the                            appendiceal orifice and IC valve. A second forward                            view of the right colon was performed. The                            colonoscopy was performed without difficulty. The                            patient tolerated the procedure well. The quality                            of the bowel preparation was good. The terminal                            ileum, ileocecal valve, appendiceal orifice, and                            rectum were photographed. Scope In: 12:57:33 PM Scope Out: 1:17:32 PM Scope Withdrawal Time: 0 hours 17 minutes 37 seconds  Total Procedure Duration: 0  hours 19 minutes 59 seconds  Findings:      The perianal and digital rectal examinations were normal.      A 5 mm polyp was found in the ascending colon. The polyp was sessile.       The polyp was removed with a cold snare. Resection and retrieval were       complete. Estimated blood loss was minimal.      The remainder of the examined colonic mucosa appeared normal.      The terminal ileum appeared normal.      The exam was otherwise without abnormality on direct and retroflexion       views. Impression:               - One 5 mm polyp in the ascending colon, removed                            with a cold snare. Resected and retrieved.                           - The entire examined colon is normal.                           - The examined portion of the ileum was normal.                           - The examination was otherwise normal on direct                             and retroflexion views.                           - Source of iron deficiency anemia not identified                            on this study. Recommendation:           - Return patient to hospital ward for ongoing care.                           - Will proceed with capsule endoscopy today for                            further evaluation of his unexplained, severe, iron                            deficiency anemia.                           - NPO until 6pm to allow for capsule endoscopy.                           - Continue present medications.                           - Await pathology results.                           -  Repeat colonoscopy date to be determined after                            pending pathology results are reviewed for                            surveillance based on pathology results. Procedure Code(s):        --- Professional ---                           [phone], Colonoscopy, flexible; with removal of                            tumor(s), polyp(s), or other lesion(s) by snare                            technique Diagnosis Code(s):        --- Professional ---                           K63.5, Polyp of colon                           D50.9, Iron deficiency anemia, unspecified CPT copyright 2019 American Medical Association. All rights reserved. The codes documented in this report are preliminary and upon coder review may  be revised to meet current compliance requirements. Thornton Park MD, MD 01/21/2019 1:37:31 PM This report has been signed electronically. Number of Addenda: 0

## 2019-01-21 NOTE — Progress Notes (Signed)
Pt swallowed Givens Capsule @ 6073. Tolerated well. Pt educated about procedure, directions remain @ bedside with pt. RN aware.

## 2019-01-21 NOTE — Anesthesia Preprocedure Evaluation (Signed)
Anesthesia Evaluation  Patient identified by MRN, date of birth, ID band Patient awake    Reviewed: Allergy & Precautions, NPO status , Patient's Chart, lab work & pertinent test results  Airway Mallampati: I  TM Distance: >3 FB Neck ROM: Full    Dental   Pulmonary COPD, former smoker,    Pulmonary exam normal        Cardiovascular Normal cardiovascular exam     Neuro/Psych Anxiety    GI/Hepatic GERD  Medicated and Controlled,(+) Hepatitis -, C  Endo/Other    Renal/GU      Musculoskeletal   Abdominal   Peds  Hematology   Anesthesia Other Findings   Reproductive/Obstetrics                             Anesthesia Physical Anesthesia Plan  ASA: II  Anesthesia Plan: MAC   Post-op Pain Management:    Induction: Intravenous  PONV Risk Score and Plan: Treatment may vary due to age or medical condition  Airway Management Planned: Nasal Cannula  Additional Equipment:   Intra-op Plan:   Post-operative Plan:   Informed Consent: I have reviewed the patients History and Physical, chart, labs and discussed the procedure including the risks, benefits and alternatives for the proposed anesthesia with the patient or authorized representative who has indicated his/her understanding and acceptance.       Plan Discussed with: CRNA and Surgeon  Anesthesia Plan Comments:         Anesthesia Quick Evaluation

## 2019-01-21 NOTE — Transfer of Care (Signed)
Immediate Anesthesia Transfer of Care Note  Patient: Dylan Duncan  Procedure(s) Performed: COLONOSCOPY WITH PROPOFOL (N/A ) BIOPSY  Patient Location: Endoscopy Unit  Anesthesia Type:MAC  Level of Consciousness: responds to stimulation  Airway & Oxygen Therapy: Patient Spontanous Breathing and Patient connected to nasal cannula oxygen  Post-op Assessment: Report given to RN and Post -op Vital signs reviewed and stable  Post vital signs: Reviewed and stable  Last Vitals:  Vitals Value Taken Time  BP    Temp    Pulse    Resp    SpO2      Last Pain:  Vitals:   01/21/19 1204  TempSrc: Temporal  PainSc: 0-No pain         Complications: No apparent anesthesia complications

## 2019-01-21 NOTE — Progress Notes (Signed)
Progress Note    Dylan Duncan  MSX:[id number] DOB: Feb 28, 1956  DOA: 01/18/2019 PCP: Patient, No Pcp Per    Brief Narrative:     Medical records reviewed and are as summarized below:  Dylan Duncan is an 63 y.o. male with medical history significant of hepatitis C, chronic alcohol, COPD,GERD came with a chief complaint shortness of breath lightheadedness generalized weakness.  Patient states had an episode of lightheadedness almost near syncopal episode yesterday.  Pain he denies any recent vomiting.  Patient states head few days ago some black stools.  He drinks 2 beers a day. In the emergency room he had a hemoglobin of 3.5.  S/p EGD now for colonoscopy in the AM  Assessment/Plan:   Active Problems:   Symptomatic anemia   GI bleed   Duodenitis   Malnutrition of moderate degree   Polyp of ascending colon   Severe anemia secondary to GI bleed -Hemoccult was negative test done by emergency room physician Patient states had some black stools in the last few days Hemoglobin 3.5--> s/p 4 units-- repeat h/h:9 -CBC this AM pending -consult GI  -upper endoscopy: no findings to support his severe anemia -colonoscopy: no source of anemia found -capsule endoscopy pending -home today or in AM?  Near syncopal episode Secondary to severe anemia  History of hepatitis C Patient states had treatment for that does not remember where and when  alcohol abuse Drinks 2 beers a day -CIWA as appears more irritable today  History of COPD Resume inhaler    Family Communication/Anticipated D/C date and plan/Code Status   DVT prophylaxis: scd Code Status: Full Code.  Family Communication:  Disposition Plan: home this PM?   Medical Consultants:    gi     Subjective:   For colonoscopy today and then small bowl capsule endoscopy   Objective:    Vitals:   01/21/19 1204 01/21/19 1323 01/21/19 1330 01/21/19 1335  BP: (!) 175/76 (!) 157/110 (!) 150/91 (!)  172/69  Pulse: (!) 56 62 (!) 58 (!) 49  Resp: (!) 22 12 (!) 22 15  Temp: 98 F (36.7 C) 97.9 F (36.6 C)    TempSrc: Temporal Temporal    SpO2: 100% 100% 100% 100%  Weight:      Height:        Intake/Output Summary (Last 24 hours) at 01/21/2019 1347 Last data filed at 01/21/2019 1319 Gross per 24 hour  Intake 3336.17 ml  Output -  Net 3336.17 ml   Filed Weights   01/19/19 1443 01/20/19 0306 01/21/19 0358  Weight: 53.5 kg 53 kg 52 kg    Exam: In bed, NAD rrr Alert and irritated about bowel prep  Data Reviewed:   I have personally reviewed following labs and imaging studies:  Labs: Labs show the following:   Basic Metabolic Panel: Recent Labs  Lab 01/18/19 1408 01/18/19 1658 01/18/19 2104 01/19/19 0128  NA 137 136  --  136  K 3.6 3.9  --  3.8  CL 103 102  --  107  CO2 21*  --   --  19*  GLUCOSE 89 92  --  99  BUN 9 10  --  10  CREATININE 0.94 0.90  --  0.86  CALCIUM 8.9  --   --  9.0  MG  --   --  2.0  --   PHOS  --   --  3.5  --    GFR Estimated Creatinine Clearance: 64.7 mL/min (  by C-G formula based on SCr of 0.86 mg/dL). Liver Function Tests: Recent Labs  Lab 01/19/19 0128 01/19/19 0336  AST 16 17  ALT 9 10  ALKPHOS 43 44  BILITOT 1.0 1.4*  PROT 6.7 6.9  ALBUMIN 3.4* 3.5   No results for input(s): LIPASE, AMYLASE in the last 168 hours. No results for input(s): AMMONIA in the last 168 hours. Coagulation profile Recent Labs  Lab 01/18/19 1650  INR 1.2    CBC: Recent Labs  Lab 01/18/19 1408 01/18/19 1658 01/19/19 0154 01/19/19 1214 01/20/19 1114  WBC 7.0  --   --   --  5.9  HGB 3.5* 5.1* 6.7* 9.0* 10.5*  HCT 13.3* 15.0* 22.3* 28.8* 34.2*  MCV 63.9*  --   --   --  77.7*  PLT 394  --   --   --  364   Cardiac Enzymes: No results for input(s): CKTOTAL, CKMB, CKMBINDEX, TROPONINI in the last 168 hours. BNP (last 3 results) No results for input(s): PROBNP in the last 8760 hours. CBG: No results for input(s): GLUCAP in the last 168  hours. D-Dimer: No results for input(s): DDIMER in the last 72 hours. Hgb A1c: No results for input(s): HGBA1C in the last 72 hours. Lipid Profile: No results for input(s): CHOL, HDL, LDLCALC, TRIG, CHOLHDL, LDLDIRECT in the last 72 hours. Thyroid function studies: Recent Labs    01/19/19 0128  TSH 1.512   Anemia work up: Recent Labs    01/18/19 2104  VITAMINB12 287  FERRITIN 4*  TIBC 528*  IRON 17*   Sepsis Labs: Recent Labs  Lab 01/18/19 1408 01/20/19 1114  WBC 7.0 5.9    Microbiology Recent Results (from the past 240 hour(s))  SARS CORONAVIRUS 2 (TAT 6-24 HRS) Nasopharyngeal Nasopharyngeal Swab     Status: None   Collection Time: 01/18/19  7:25 PM   Specimen: Nasopharyngeal Swab  Result Value Ref Range Status   SARS Coronavirus 2 NEGATIVE NEGATIVE Final    Comment: (NOTE) SARS-CoV-2 target nucleic acids are NOT DETECTED. The SARS-CoV-2 RNA is generally detectable in upper and lower respiratory specimens during the acute phase of infection. Negative results do not preclude SARS-CoV-2 infection, do not rule out co-infections with other pathogens, and should not be used as the sole basis for treatment or other patient management decisions. Negative results must be combined with clinical observations, patient history, and epidemiological information. The expected result is Negative. Fact Sheet for Patients: SugarRoll.be Fact Sheet for Healthcare Providers: https://www.woods-mathews.com/ This test is not yet approved or cleared by the [place] FDA and  has been authorized for detection and/or diagnosis of SARS-CoV-2 by FDA under an Emergency Use Authorization (EUA). This EUA will remain  in effect (meaning this test can be used) for the duration of the COVID-19 declaration under Section 56 4(b)(1) of the Act, 21 U.S.C. section 360bbb-3(b)(1), unless the authorization is terminated or revoked sooner. Performed at  [facility], [place] [address] [place], [place] [address]     Procedures and diagnostic studies:  No results found.  Medications:   . [MAR Hold] sodium chloride   Intravenous Once  . [MAR Hold] fluticasone furoate-vilanterol  1 puff Inhalation Daily  . [MAR Hold] metoCLOPramide (REGLAN) injection  10 mg Intravenous Once  . [MAR Hold] multivitamin with minerals  1 tablet Oral Daily  . [MAR Hold] pantoprazole (PROTONIX) IV  40 mg Intravenous Q12H  . [MAR Hold] peg 3350 powder  1 kit Oral Once  . [  MAR Hold] polyethylene glycol  2,000 mL Oral Once  . [MAR Hold] sodium chloride flush  3 mL Intravenous Q12H   Continuous Infusions: . [MAR Hold] sodium chloride    . [MAR Hold] sodium chloride    . sodium chloride 75 mL/hr at 01/21/19 0841     LOS: 3 days   Geradine Girt  [facility]   How to contact the [facility] Attending or Consulting provider [place] or covering provider during after hours [place], for this patient?  1. Check the care team in [facility] and look for a) attending/consulting [facility] provider listed and b) the [facility] team listed 2. Log into [email or web address] and use [place] universal password to access. If you do not have the password, please contact the hospital operator. 3. Locate the [facility] provider you are looking for under [facility] and page to a number that you can be directly reached. 4. If you still have difficulty reaching the provider, please page the [facility] (Director on Call) for the Hospitalists listed on amion for assistance.  01/21/2019, 1:47 PM

## 2019-01-21 NOTE — Anesthesia Postprocedure Evaluation (Signed)
Anesthesia Post Note  Patient: Beaulah Dinning  Procedure(s) Performed: COLONOSCOPY WITH PROPOFOL (N/A ) BIOPSY GIVENS CAPSULE STUDY     Patient location during evaluation: PACU Anesthesia Type: MAC Level of consciousness: awake and alert Pain management: pain level controlled Vital Signs Assessment: post-procedure vital signs reviewed and stable Respiratory status: spontaneous breathing, nonlabored ventilation, respiratory function stable and patient connected to nasal cannula oxygen Cardiovascular status: stable and blood pressure returned to baseline Postop Assessment: no apparent nausea or vomiting Anesthetic complications: no    Last Vitals:  Vitals:   01/21/19 1330 01/21/19 1335  BP: (!) 150/91 (!) 172/69  Pulse: (!) 58 (!) 49  Resp: (!) 22 15  Temp:    SpO2: 100% 100%    Last Pain:  Vitals:   01/21/19 1335  TempSrc:   PainSc: 0-No pain                 Sevyn Paredez DAVID

## 2019-01-23 ENCOUNTER — Encounter (HOSPITAL_COMMUNITY): Payer: Self-pay | Admitting: Gastroenterology

## 2019-01-24 ENCOUNTER — Telehealth: Payer: Self-pay | Admitting: *Deleted

## 2019-01-24 LAB — SURGICAL PATHOLOGY

## 2019-01-24 NOTE — Telephone Encounter (Signed)
Attempted to call the patient to schedule the patient for f/u. No answer, left detailed message for the patient to call back.

## 2019-01-24 NOTE — Discharge Summary (Signed)
PATIENT LEFT AMA.  Please see progress note from 11/6 Eulogio Bear DO

## 2019-01-24 NOTE — Telephone Encounter (Signed)
-----   Message from Thornton Park, MD sent at 01/22/2019  7:54 AM EST ----- Dylan Duncan was admitted with unexplained, profound iron deficiency anemia with melena presenting with hemoglobin of 3.5. His EGD showed H pylori gastritis with mild endoscopic gastritis, mild duodenitis, and a gastric polyp. Colonoscopy showed a small polyp. He had a capsule endoscopy yesterday after the colonoscopy but left AMA last night. He will need to have the capsule reviewed and his H pylori treated as an outpatient. Unfortunately, it looks like he left without any follow-up plans in place.   Dylan Duncan, Please arrange for a repeat CBC this week and an office follow-up with an APP or Dr. Loletha Carrow in the next couple of weeks. Thank you.

## 2019-01-25 ENCOUNTER — Inpatient Hospital Stay: Payer: Medicaid Other | Admitting: Family Medicine

## 2019-01-25 NOTE — Telephone Encounter (Signed)
Attempted to call the patient a second time to schedule the patient for f/u and labs. No answer, left detailed message for the patient to call back.

## 2019-01-26 ENCOUNTER — Encounter: Payer: Self-pay | Admitting: *Deleted

## 2019-01-26 ENCOUNTER — Encounter: Payer: Self-pay | Admitting: Gastroenterology

## 2019-01-26 NOTE — Telephone Encounter (Signed)
Dr. Loletha Carrow, Dylan Duncan-  Attempted to call the patient a third time to schedule the patient for f/u and labs. No answer. Left 3rd detailed message for the patient to call back.   The patient will be mailed a letter requesting to call the office to schedule f/u.

## 2019-01-27 NOTE — Telephone Encounter (Signed)
Yes, he needs office follow-up if he is willing to do so.  His small bowel video capsule study was reviewed and shows multiple nonbleeding AVMs.

## 2019-01-28 NOTE — Telephone Encounter (Addendum)
Attempted to call the patient a 4th time, no answer, left fourth message. Called the patient's sister (emergency contact), left message requesting a call back.   Letter mailed to the patient yesterday.

## 2019-02-04 ENCOUNTER — Other Ambulatory Visit: Payer: Self-pay | Admitting: *Deleted

## 2019-02-04 ENCOUNTER — Other Ambulatory Visit (INDEPENDENT_AMBULATORY_CARE_PROVIDER_SITE_OTHER): Payer: Medicaid Other

## 2019-02-04 DIAGNOSIS — D500 Iron deficiency anemia secondary to blood loss (chronic): Secondary | ICD-10-CM

## 2019-02-04 LAB — CBC WITH DIFFERENTIAL/PLATELET
Basophils Absolute: 0.1 10*3/uL (ref 0.0–0.1)
Basophils Relative: 1.9 % (ref 0.0–3.0)
Eosinophils Absolute: 0.1 10*3/uL (ref 0.0–0.7)
Eosinophils Relative: 1.9 % (ref 0.0–5.0)
HCT: 30.6 % — ABNORMAL LOW (ref 39.0–52.0)
Hemoglobin: 9.6 g/dL — ABNORMAL LOW (ref 13.0–17.0)
Lymphocytes Relative: 25.9 % (ref 12.0–46.0)
Lymphs Abs: 1.6 10*3/uL (ref 0.7–4.0)
MCHC: 31.4 g/dL (ref 30.0–36.0)
MCV: 76.7 fl — ABNORMAL LOW (ref 78.0–100.0)
Monocytes Absolute: 0.6 10*3/uL (ref 0.1–1.0)
Monocytes Relative: 9.8 % (ref 3.0–12.0)
Neutro Abs: 3.8 10*3/uL (ref 1.4–7.7)
Neutrophils Relative %: 60.5 % (ref 43.0–77.0)
Platelets: 253 10*3/uL (ref 150.0–400.0)
RBC: 3.98 Mil/uL — ABNORMAL LOW (ref 4.22–5.81)
RDW: 31.9 % — ABNORMAL HIGH (ref 11.5–15.5)
WBC: 6.3 10*3/uL (ref 4.0–10.5)

## 2019-02-04 NOTE — Telephone Encounter (Addendum)
Finally spoke with the patient who will be coming in today for a CBC and has been scheduled to see AE on 12/8 at 11:00 am. Patient verbalized understanding and ensured he would be in for labs and see AE at his scheduled appt.

## 2019-02-04 NOTE — Progress Notes (Signed)
Entered in error

## 2019-02-04 NOTE — Telephone Encounter (Signed)
Pt returning call to [facility].

## 2019-02-22 ENCOUNTER — Telehealth: Payer: Self-pay

## 2019-02-22 ENCOUNTER — Other Ambulatory Visit (INDEPENDENT_AMBULATORY_CARE_PROVIDER_SITE_OTHER): Payer: Medicaid Other

## 2019-02-22 ENCOUNTER — Ambulatory Visit (INDEPENDENT_AMBULATORY_CARE_PROVIDER_SITE_OTHER): Payer: Medicaid Other | Admitting: Physician Assistant

## 2019-02-22 ENCOUNTER — Other Ambulatory Visit: Payer: Self-pay

## 2019-02-22 ENCOUNTER — Encounter: Payer: Self-pay | Admitting: Physician Assistant

## 2019-02-22 VITALS — BP 124/70 | HR 84 | Temp 98.7°F | Ht 71.5 in | Wt 122.1 lb

## 2019-02-22 DIAGNOSIS — K922 Gastrointestinal hemorrhage, unspecified: Secondary | ICD-10-CM

## 2019-02-22 DIAGNOSIS — D500 Iron deficiency anemia secondary to blood loss (chronic): Secondary | ICD-10-CM | POA: Diagnosis not present

## 2019-02-22 DIAGNOSIS — D509 Iron deficiency anemia, unspecified: Secondary | ICD-10-CM

## 2019-02-22 DIAGNOSIS — K5520 Angiodysplasia of colon without hemorrhage: Secondary | ICD-10-CM

## 2019-02-22 DIAGNOSIS — K920 Hematemesis: Secondary | ICD-10-CM

## 2019-02-22 LAB — CBC WITH DIFFERENTIAL/PLATELET
Basophils Absolute: 0 10*3/uL (ref 0.0–0.1)
Basophils Relative: 0.8 % (ref 0.0–3.0)
Eosinophils Absolute: 0.2 10*3/uL (ref 0.0–0.7)
Eosinophils Relative: 2.6 % (ref 0.0–5.0)
HCT: 33.3 % — ABNORMAL LOW (ref 39.0–52.0)
Hemoglobin: 10.3 g/dL — ABNORMAL LOW (ref 13.0–17.0)
Lymphocytes Relative: 22.1 % (ref 12.0–46.0)
Lymphs Abs: 1.3 10*3/uL (ref 0.7–4.0)
MCHC: 30.8 g/dL (ref 30.0–36.0)
MCV: 79.9 fl (ref 78.0–100.0)
Monocytes Absolute: 0.7 10*3/uL (ref 0.1–1.0)
Monocytes Relative: 12.7 % — ABNORMAL HIGH (ref 3.0–12.0)
Neutro Abs: 3.6 10*3/uL (ref 1.4–7.7)
Neutrophils Relative %: 61.8 % (ref 43.0–77.0)
Platelets: 239 10*3/uL (ref 150.0–400.0)
RBC: 4.17 Mil/uL — ABNORMAL LOW (ref 4.22–5.81)
RDW: 31.8 % — ABNORMAL HIGH (ref 11.5–15.5)
WBC: 5.9 10*3/uL (ref 4.0–10.5)

## 2019-02-22 NOTE — Progress Notes (Addendum)
Subjective:    Patient ID: Dylan Duncan, male    DOB: 05/16/1955, 63 y.o.   MRN: [id number]  HPI Dylan Duncan is a pleasant 63 year old African-American male, established with Dr. Loletha Carrow who comes in today for post hospital follow-up after recent admission 01/18/2019 for symptomatic anemia. On admit patient had hemoglobin in the 3.5 range, with MCV of 63, INR 1.2, and ferritin of 4..  He received several units of packed RBCs and on discharge 01/20/2019 hemoglobin was 10.5 hematocrit of 34.2.  He returned for labs on 02/04/2019 with hemoglobin 9.6 hematocrit of 30.6.  Patient was having melena at the time of admission. He does have prior history of hepatitis C which was treated and eradicated, EtOH abuse, and portal hypertensive gastropathy.  Also with cholelithiasis. He underwent EGD and colonoscopy during admission with Dr. Tarri Glenn.  EGD revealed mild gastritis, there were no gastric or esophageal varices and no significant portal gastropathy he did have duodenitis.  A 3 mm gastric polyp was biopsied .  Biopsies of the stomach showed chronic active gastritis positive for H. pylori. Colonoscopy revealed one 5 mm polyp in the ascending colon which was a sessile serrated adenoma. Patient had capsule endoscopy during admission as well.  Patient left AMA after completing the capsule.  Capsule endoscopy showed several small bowel AVMs, the largest most proximally  within 30 minutes of the first duodenal image, but very rapid transit to Colon  Patient says he has been feeling okay over the past couple of weeks does have some fatigue and occasional exertional dyspnea but nothing like what he had previously.  He has not noted any melena or hematochezia.  As he left AMA he was not started on oral iron, PPI etc.  He has no complaints of abdominal pain or discomfort and appetite has been good.  He denies any aspirin or NSAID use.  Review of Systems Pertinent positive and negative review of systems were noted in  the above HPI section.  All other review of systems was otherwise negative.  Outpatient Encounter Medications as of 02/22/2019  Medication Sig  . albuterol (PROVENTIL HFA;VENTOLIN HFA) 108 (90 Base) MCG/ACT inhaler Inhale 2 puffs into the lungs every 4 (four) hours as needed for wheezing or shortness of breath.  . Fluticasone-Salmeterol (ADVAIR DISKUS) 250-50 MCG/DOSE AEPB Inhale 1 puff into the lungs every 12 (twelve) hours. (Patient not taking: Reported on 02/22/2019)  . [DISCONTINUED] oxyCODONE-acetaminophen (PERCOCET) 10-325 MG tablet Take 1 tablet by mouth every 8 (eight) hours as needed for pain.  . [DISCONTINUED] PEG-KCl-NaCl-NaSulf-Na Asc-C (PLENVU) 140 g SOLR Take 140 g by mouth as directed. (Patient not taking: Reported on 01/18/2019)   No facility-administered encounter medications on file as of 02/22/2019.    Allergies  Allergen Reactions  . Lactose Intolerance (Gi) Other (See Comments)    Throws up  . Penicillins Itching   Patient Active Problem List   Diagnosis Date Noted  . Iron deficiency anemia due to chronic blood loss 02/22/2019  . Malnutrition of moderate degree 01/21/2019  . Polyp of ascending colon   . Duodenitis   . Symptomatic anemia 01/18/2019  . GI bleed 01/18/2019  . Sprain of right knee 08/18/2018  . Asymptomatic gallstones 08/04/2012  . Steatohepatitis, HCV & EtOH 08/04/2012  . History of ETOH abuse 08/04/2012  . Portal hypertensive gastropathy ([place]) 06/27/2012  . Hematemesis 06/26/2012  . Hepatitis C   . Asthma    Social History   Socioeconomic History  . Marital status: Single  Spouse name: Not on file  . Number of children: Not on file  . Years of education: Not on file  . Highest education level: Not on file  Occupational History  . Occupation: disabled    Comment: former Set designer  Social Needs  . Financial resource strain: Not on file  . Food insecurity    Worry: Patient refused    Inability: Patient refused  . Transportation  needs    Medical: Not on file    Non-medical: Not on file  Tobacco Use  . Smoking status: Former Smoker    Packs/day: 0.14    Years: 4.00    Pack years: 0.56    Types: Cigarettes    Quit date: 03/17/2010    Years since quitting: 8.9  . Smokeless tobacco: Never Used  Substance and Sexual Activity  . Alcohol use: Yes    Alcohol/week: 2.0 standard drinks    Types: 2 Cans of beer per week    Comment: Daily.  . Drug use: Yes    Frequency: 2.0 times per week    Types: Marijuana  . Sexual activity: Yes    Birth control/protection: Condom  Lifestyle  . Physical activity    Days per week: Not on file    Minutes per session: Not on file  . Stress: Not on file  Relationships  . Social Musician on phone: Not on file    Gets together: Not on file    Attends religious service: Not on file    Active member of club or organization: Not on file    Attends meetings of clubs or organizations: Not on file    Relationship status: Not on file  . Intimate partner violence    Fear of current or ex partner: Not on file    Emotionally abused: Not on file    Physically abused: Not on file    Forced sexual activity: Not on file  Other Topics Concern  . Not on file  Social History Narrative  . Not on file    Dylan Duncan's family history includes Heart disease in his father; Pneumonia in his mother.      Objective:    Vitals:   02/22/19 1112  BP: 124/70  Pulse: 84  Temp: 98.7 F (37.1 C)    Physical Exam Well-developed well-nourished older African-American male, very thin ,in no acute distress.   Weight, 122 BMI 16.8  HEENT; nontraumatic normocephalic, EOMI, PER R LA, sclera anicteric. Oropharynx; not examined/mask/Covid Neck; supple, no JVD Cardiovascular; regular rate and rhythm with S1-S2, no murmur rub or gallop Pulmonary; Clear bilaterally Abdomen; soft, nontender, nondistended, no palpable mass or hepatosplenomegaly, bowel sounds are active Rectal; not done today  Skin; benign exam, no jaundice rash or appreciable lesions Extremities; very thin, no clubbing cyanosis or edema skin warm and dry Neuro/Psych; alert and oriented x4, grossly nonfocal mood and affect appropriate       Assessment & Plan:   #35 63 year old African-American male with severe iron deficiency anemia, initially present with melena at time of recent hospitalization. EGD pertinent for duodenitis, and gastric biopsies have returned positive for H. Pylori Colonoscopy pertinent for a sessile serrated adenoma Capsule endoscopy positive for several proximal small bowel AVMs which are likely source of his blood loss and iron deficiency anemia.  Duodenitis may have been contributing as well.  #2 history of hepatitis C treated and eradicated #3.  History of EtOH abuse/fatty liver by previous ultrasound #4 cholelithiasis  Plan; repeat CBC today, and will need to continue to monitor every couple of weeks until stable. Will schedule for IV Feraheme infusions x2. Discussed possibility of enteroscopy per Dr. Myrtie Neitheranis  for ablation of AVM's if hemoglobin continues to drift.  Will decide based on follow-up hemoglobin.  Advised patient to avoid aspirin and NSAIDs, and EtOH. He will be indicated for 5-year interval follow-up colonoscopy. Will treat H. pylori with 10-day course of Pylera, in combination with omeprazole 20 mg p.o. twice daily.   Amy S Esterwood PA-C 02/22/2019   Cc: No ref. provider found

## 2019-02-22 NOTE — Patient Instructions (Signed)
Your provider has requested that you go to the basement level for lab work before leaving today. Press "B" on the elevator. The lab is located at the first door on the left as you exit the elevator.   Stay off alcohol.   Don't take any aspirin, BC powders, etc.   Our office will call you about setting up the IV iron infusions.   I appreciate the opportunity to care for you. Amy Esterwood, PA-C

## 2019-02-22 NOTE — Telephone Encounter (Signed)
Called patient home. No answer. Left him a message asking he call me back about scheduling the infusions. Orders are in [place]. I will confirm the pharmacy before sending in any prescriptions if the patient is agreeable to treatment of H Pylori.

## 2019-02-22 NOTE — Telephone Encounter (Signed)
-----   Message from Alfredia Ferguson, [place] sent at 02/22/2019  2:40 PM EST ----- Regarding: Iron infusion and meds Beth , I saw this pt today - he needs to be scheduled for Fereheme infusions x 2 for severe iron deficiency anemia.  Also after I reviewed his Bx's - he was + for Hpylori- please let him know he has a bacteris in his stomach lining that can cause ulcers and we should treat to get rid of it - please call Rx for Pylera x 10 days ( or equivalent generics) and Omeprazole 20 mg po BID x 2 weeks.Thank you

## 2019-02-24 NOTE — Telephone Encounter (Signed)
Left message on the patient's voicemail asking he call back. I left my name and phone number as well as the purpose of the call.

## 2019-03-09 NOTE — Telephone Encounter (Signed)
Left a message to call back.

## 2019-03-15 NOTE — Telephone Encounter (Signed)
Ok please try  again next week to get in touch with him

## 2019-03-15 NOTE — Telephone Encounter (Signed)
Left message to call about scheduling iron infusion and beginning treatment for Hpylori.

## 2019-03-22 ENCOUNTER — Other Ambulatory Visit: Payer: Self-pay

## 2019-04-12 NOTE — Telephone Encounter (Signed)
Called patient. No answer. Got his voicemail. Left a message to call back to discuss even if he is not interested in pursuing. Would like to hear back from him with his decision.

## 2019-04-18 ENCOUNTER — Other Ambulatory Visit: Payer: Self-pay

## 2019-04-18 NOTE — Telephone Encounter (Signed)
Letter mailed to the patient

## 2019-04-25 NOTE — Telephone Encounter (Signed)
Still no response.

## 2019-06-08 ENCOUNTER — Emergency Department (HOSPITAL_COMMUNITY): Payer: Medicaid Other

## 2019-06-08 ENCOUNTER — Other Ambulatory Visit: Payer: Self-pay

## 2019-06-08 ENCOUNTER — Encounter (HOSPITAL_COMMUNITY): Payer: Self-pay | Admitting: *Deleted

## 2019-06-08 ENCOUNTER — Emergency Department (HOSPITAL_COMMUNITY)
Admission: EM | Admit: 2019-06-08 | Discharge: 2019-06-08 | Disposition: A | Discharge status: Home / Self Care | Payer: Medicaid Other | Attending: Emergency Medicine | Admitting: Emergency Medicine

## 2019-06-08 DIAGNOSIS — S3992XA Unspecified injury of lower back, initial encounter: Secondary | ICD-10-CM | POA: Diagnosis present

## 2019-06-08 DIAGNOSIS — Y929 Unspecified place or not applicable: Secondary | ICD-10-CM | POA: Insufficient documentation

## 2019-06-08 DIAGNOSIS — Y939 Activity, unspecified: Secondary | ICD-10-CM | POA: Diagnosis not present

## 2019-06-08 DIAGNOSIS — Z87891 Personal history of nicotine dependence: Secondary | ICD-10-CM | POA: Diagnosis not present

## 2019-06-08 DIAGNOSIS — Y999 Unspecified external cause status: Secondary | ICD-10-CM | POA: Diagnosis not present

## 2019-06-08 DIAGNOSIS — J449 Chronic obstructive pulmonary disease, unspecified: Secondary | ICD-10-CM | POA: Diagnosis not present

## 2019-06-08 DIAGNOSIS — S39012A Strain of muscle, fascia and tendon of lower back, initial encounter: Secondary | ICD-10-CM | POA: Insufficient documentation

## 2019-06-08 DIAGNOSIS — X58XXXA Exposure to other specified factors, initial encounter: Secondary | ICD-10-CM | POA: Diagnosis not present

## 2019-06-08 HISTORY — DX: Alcohol abuse, uncomplicated: F10.10

## 2019-06-08 LAB — CBC WITH DIFFERENTIAL/PLATELET
Abs Immature Granulocytes: 0.01 10*3/uL (ref 0.00–0.07)
Basophils Absolute: 0 10*3/uL (ref 0.0–0.1)
Basophils Relative: 1 %
Eosinophils Absolute: 0.2 10*3/uL (ref 0.0–0.5)
Eosinophils Relative: 3 %
HCT: 32.9 % — ABNORMAL LOW (ref 39.0–52.0)
Hemoglobin: 10.3 g/dL — ABNORMAL LOW (ref 13.0–17.0)
Immature Granulocytes: 0 %
Lymphocytes Relative: 20 %
Lymphs Abs: 1 10*3/uL (ref 0.7–4.0)
MCH: 29 pg (ref 26.0–34.0)
MCHC: 31.3 g/dL (ref 30.0–36.0)
MCV: 92.7 fL (ref 80.0–100.0)
Monocytes Absolute: 0.6 10*3/uL (ref 0.1–1.0)
Monocytes Relative: 12 %
Neutro Abs: 3.1 10*3/uL (ref 1.7–7.7)
Neutrophils Relative %: 64 %
Platelets: 167 10*3/uL (ref 150–400)
RBC: 3.55 MIL/uL — ABNORMAL LOW (ref 4.22–5.81)
RDW: 17.7 % — ABNORMAL HIGH (ref 11.5–15.5)
WBC: 4.9 10*3/uL (ref 4.0–10.5)
nRBC: 0 % (ref 0.0–0.2)

## 2019-06-08 LAB — URINALYSIS, ROUTINE W REFLEX MICROSCOPIC
Bilirubin Urine: NEGATIVE
Glucose, UA: NEGATIVE mg/dL
Hgb urine dipstick: NEGATIVE
Ketones, ur: NEGATIVE mg/dL
Leukocytes,Ua: NEGATIVE
Nitrite: NEGATIVE
Protein, ur: NEGATIVE mg/dL
Specific Gravity, Urine: 1.004 — ABNORMAL LOW (ref 1.005–1.030)
pH: 7 (ref 5.0–8.0)

## 2019-06-08 LAB — COMPREHENSIVE METABOLIC PANEL
ALT: 49 U/L — ABNORMAL HIGH (ref 0–44)
AST: 104 U/L — ABNORMAL HIGH (ref 15–41)
Albumin: 3.9 g/dL (ref 3.5–5.0)
Alkaline Phosphatase: 63 U/L (ref 38–126)
Anion gap: 13 (ref 5–15)
BUN: 7 mg/dL — ABNORMAL LOW (ref 8–23)
CO2: 23 mmol/L (ref 22–32)
Calcium: 9.4 mg/dL (ref 8.9–10.3)
Chloride: 104 mmol/L (ref 98–111)
Creatinine, Ser: 0.86 mg/dL (ref 0.61–1.24)
GFR calc Af Amer: >60 mL/min (ref >60)
GFR calc non Af Amer: >60 mL/min (ref >60)
Glucose, Bld: 95 mg/dL (ref 70–99)
Potassium: 4 mmol/L (ref 3.5–5.1)
Sodium: 140 mmol/L (ref 135–145)
Total Bilirubin: 0.6 mg/dL (ref 0.3–1.2)
Total Protein: 7.5 g/dL (ref 6.5–8.1)

## 2019-06-08 LAB — LIPASE, BLOOD: Lipase: 28 U/L (ref 11–51)

## 2019-06-08 MED ORDER — SODIUM CHLORIDE 0.9 % IV BOLUS
1000.0000 mL | Freq: Once | INTRAVENOUS | Status: AC
  Administered 2019-06-08: 1000 mL via INTRAVENOUS

## 2019-06-08 MED ORDER — SODIUM CHLORIDE 0.9 % IV SOLN
INTRAVENOUS | Status: DC
  Administered 2019-06-08: 1000 mL via INTRAVENOUS

## 2019-06-08 MED ORDER — MORPHINE SULFATE (PF) 4 MG/ML IV SOLN: 4.0000 mg | Freq: Once | INTRAVENOUS | Status: DC

## 2019-06-08 MED ORDER — KETOROLAC TROMETHAMINE 15 MG/ML IJ SOLN: 15.0000 mg | Freq: Once | INTRAMUSCULAR | Status: DC

## 2019-06-08 MED ORDER — KETOROLAC TROMETHAMINE 15 MG/ML IJ SOLN
30.0000 mg | Freq: Once | INTRAMUSCULAR | Status: AC
  Administered 2019-06-08: 10:00:00 30 mg via INTRAVENOUS
  Filled 2019-06-08: qty 2

## 2019-06-08 NOTE — ED Provider Notes (Signed)
[facility] EMERGENCY DEPARTMENT Provider Note   CSN: [id number] Arrival date & time: 06/08/19  8338     History Chief Complaint  Patient presents with  . Back Pain   CORDNEY BARSTOW is a 64 y.o. male.  Mr. Suezanne Jacquet is a 64 year old gentleman with a history of COPD, hep C, alcohol abuse, cirrhosis and small bowel AVMs who presents with right-sided flank pain.  The patient states that his pain is sharp and stabbing and 10/10 in severity.  His symptoms started yesterday after he woke up.  He says the pain has progressively gotten worse and worse. Its exacerbated by deep inspirations and coughing, and not relieved by anything.  He states he tries to change his position to get more comfortable but has been unsuccessful.  He denies any fevers, SOB, or changes in his bowel or bladder habits.  Patient denies any hematochezia or melena.         Past Medical History:  Diagnosis Date  . Acid reflux   . Allergy   . Asthma   . Cataract   . COPD (chronic obstructive pulmonary disease) (HCC)    ?Uses Adviar?  . Emphysema of lung (HCC)   . ETOH abuse   . GERD (gastroesophageal reflux disease)   . Hepatic steatosis   . 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 IDA (iron deficiency anemia)   . Liver fibrosis    fibrotest F4,elastography F2-F3  . Post traumatic stress disorder (PTSD)   . Substance abuse [facility])    Patient Active Problem List   Diagnosis Date Noted  . Iron deficiency anemia due to chronic blood loss 02/22/2019  . Malnutrition of moderate degree 01/21/2019  . Polyp of ascending colon   . Duodenitis   . Symptomatic anemia 01/18/2019  . GI bleed 01/18/2019  . Sprain of right knee 08/18/2018  . Asymptomatic gallstones 08/04/2012  . Steatohepatitis, HCV & EtOH 08/04/2012  . History of ETOH abuse 08/04/2012  . Portal hypertensive gastropathy (HCC) 06/27/2012  . Hematemesis 06/26/2012  . Hepatitis C   . Asthma     Past Surgical History:  Procedure Laterality Date  . arm surgery Right   . BIOPSY  01/20/2019   Procedure: BIOPSY;  Surgeon: Tressia Danas, MD;  Location: [facility] ENDOSCOPY;  Service: Gastroenterology;;  . BIOPSY  01/21/2019   Procedure: BIOPSY;  Surgeon: Tressia Danas, MD;  Location: [facility] ENDOSCOPY;  Service: Gastroenterology;;  . COLONOSCOPY WITH PROPOFOL N/A 01/21/2019   Procedure: COLONOSCOPY WITH PROPOFOL;  Surgeon: Tressia Danas, MD;  Location: [facility] ENDOSCOPY;  Service: Gastroenterology;  Laterality: N/A;  . ESOPHAGOGASTRODUODENOSCOPY N/A 06/27/2012   Procedure: ESOPHAGOGASTRODUODENOSCOPY (EGD);  Surgeon: [name], MD;  Location[facility] ENDOSCOPY;  Service: Endoscopy;  Laterality: N/A;  . ESOPHAGOGASTRODUODENOSCOPY (EGD) WITH PROPOFOL N/A 01/20/2019   Procedure: ESOPHAGOGASTRODUODENOSCOPY (EGD) WITH PROPOFOL;  Surgeon: Tressia Danas, MD;  Location: [facility] ENDOSCOPY;  Service: Gastroenterology;  Laterality: N/A;  . GIVENS CAPSULE STUDY  01/21/2019   Procedure: GIVENS CAPSULE STUDY;  Surgeon: Tressia Danas, MD;  Location: [facility] ENDOSCOPY;  Service: Gastroenterology;;  . MANDIBLE SURGERY       Family History  Problem Relation Age of Onset  . Pneumonia Mother   . Heart disease Father        Hx of MI  . Colon cancer Neg Hx   . Esophageal cancer Neg Hx   . Liver cancer Neg Hx   . Pancreatic  cancer Neg Hx   . Rectal cancer Neg Hx   . Stomach cancer Neg Hx    Social History   Tobacco Use  . Smoking status: Former Smoker    Packs/day: 0.14    Years: 4.00    Pack years: 0.56    Types: Cigarettes    Quit date: 03/17/2010    Years since quitting: 9.2  . Smokeless tobacco: Never Used  Substance Use Topics  . Alcohol use: Yes    Alcohol/week: 3.0 standard drinks    Types: 3 Cans of beer per week    Comment: Daily. 12 oz cans daily  . Drug use: Yes    Frequency: 2.0 times per week    Types: Marijuana   Home Medications Prior to Admission medications   Medication Sig Start  Date End Date Taking? Authorizing Provider  albuterol (PROVENTIL HFA;VENTOLIN HFA) 108 (90 Base) MCG/ACT inhaler Inhale 2 puffs into the lungs every 4 (four) hours as needed for wheezing or shortness of breath. 04/06/17   Pricilla Loveless, MD  Fluticasone-Salmeterol (ADVAIR DISKUS) 250-50 MCG/DOSE AEPB Inhale 1 puff into the lungs every 12 (twelve) hours. Patient not taking: Reported on 02/22/2019 04/06/17   Pricilla Loveless, MD   Allergies    Lactose intolerance (gi) and Penicillins  Review of Systems   Review of Systems  All other systems reviewed and are negative.  Physical Exam Updated Vital Signs BP 121/70 (BP Location: Left Arm)   Pulse 74   Temp 98.8 F (37.1 C) (Oral)   Resp 18   Ht 6' (1.829 m)   Wt 55.4 kg   SpO2 98%   BMI 16.56 kg/m   Physical Exam Vitals reviewed.  Constitutional:      General: He is in acute distress.     Appearance: He is ill-appearing. He is not toxic-appearing or diaphoretic.     Comments: Thin appearing  HENT:     Head: Normocephalic and atraumatic.  Eyes:     General:        Right eye: No discharge.        Left eye: No discharge.     Extraocular Movements: Extraocular movements intact.  Cardiovascular:     Rate and Rhythm: Normal rate.     Pulses: Normal pulses.     Heart sounds: Normal heart sounds. No murmur. No friction rub. No gallop.   Pulmonary:     Effort: Pulmonary effort is normal. No respiratory distress.     Breath sounds: Normal breath sounds. No wheezing or rales.  Abdominal:     General: Abdomen is flat. Bowel sounds are normal. There is no distension.     Palpations: Abdomen is soft.     Tenderness: There is no abdominal tenderness. There is no right CVA tenderness, left CVA tenderness or guarding.  Musculoskeletal:     Right lower leg: No edema.     Left lower leg: No edema.  Skin:    General: Skin is warm.  Neurological:     General: No focal deficit present.     Mental Status: He is alert and oriented to person,  place, and time.  Psychiatric:        Mood and Affect: Mood normal.    ED Results / Procedures / Treatments   Labs (all labs ordered are listed, but only abnormal results are displayed) Labs Reviewed  URINALYSIS, ROUTINE W REFLEX MICROSCOPIC  COMPREHENSIVE METABOLIC PANEL  LIPASE, BLOOD  CBC WITH DIFFERENTIAL/PLATELET   EKG None  Radiology  No results found.  Procedures Procedures (including critical care time)  Medications Ordered in ED Medications - No data to display  ED Course  I have reviewed the triage vital signs and the nursing notes.  Pertinent labs & imaging results that were available during my care of the patient were reviewed by me and considered in my medical decision making (see chart for details).    MDM Rules/Calculators/A&P                       Mr. Broussard's presentation of acute right-sided flank pain is suspicious for a kidney stone.  Patient does not have a history of stones in the past.  Will obtain CT renal study in order fluids and analgesics.  Patient states his pain level has decreased from a 10/10-5/10 after administration of Toradol.  We will continue to monitor.  CT renal study did not demonstrate any abnormalities.  Patient still feels better after getting Toradol earlier.  He thinks that he can go home and manage his symptoms.  Patient was advised to follow-up with her primary doctor soon as possible.  He is in agreement.  Will provide referral.  Patient is medically stable to be discharged at this time.  Final Clinical Impression(s) / ED Diagnoses Final diagnoses:  None   Rx / DC Orders ED Discharge Orders    None       Earlene Plater, MD 06/08/19 1323    Isla Pence, MD 06/08/19 1335

## 2019-06-08 NOTE — Discharge Instructions (Addendum)
You for allowing [place] to take care of you today.  Below is a summary of what we treated:  1.  Back pain -CT scan did not show any abnormalities.  I think your pain is due to muscle soreness.  We treated with a medication called Toradol. -You may take ibuprofen as needed for the soreness.  Please take this medication with food.  Use it only when you feel like you absolutely needed.  2.  Follow-up -Everyone needs a primary care doctor.  Please schedule an appointment as soon as possible.  If you have increased pain, fevers, nausea and vomiting, it may be a sign that there is an infection.  Please come back to the ED if that is the case.

## 2019-06-08 NOTE — ED Triage Notes (Signed)
Pt reports upper RT back /Flank  Pain . Pt also reports his urine was dark this AM.

## 2019-06-08 NOTE — ED Notes (Signed)
Patient verbalizes understanding of discharge instructions . Opportunity for questions and answers were provided . Armband removed by staff ,Pt discharged from ED. W/C  offered at D/C  and Declined W/C at D/C and was escorted to lobby by RN.  

## 2020-01-21 ENCOUNTER — Emergency Department (HOSPITAL_COMMUNITY)
Admission: EM | Admit: 2020-01-21 | Discharge: 2020-01-22 | Discharge status: Against Medical Advice | Payer: Medicaid Other

## 2020-01-21 NOTE — ED Notes (Signed)
Pt has decided no to stay. Pt encouraged to stay because of complaints, but pt refused.

## 2020-03-28 ENCOUNTER — Other Ambulatory Visit: Payer: Self-pay | Admitting: Nurse Practitioner

## 2020-03-28 DIAGNOSIS — K7469 Other cirrhosis of liver: Secondary | ICD-10-CM

## 2020-03-30 ENCOUNTER — Ambulatory Visit
Admission: RE | Admit: 2020-03-30 | Discharge: 2020-03-30 | Disposition: A | Discharge status: Home / Self Care | Payer: Medicare Other | Source: Ambulatory Visit | Attending: Nurse Practitioner | Admitting: Nurse Practitioner

## 2020-03-30 ENCOUNTER — Encounter: Payer: Self-pay | Admitting: General Practice

## 2020-03-30 DIAGNOSIS — K7469 Other cirrhosis of liver: Secondary | ICD-10-CM

## 2020-06-08 DIAGNOSIS — Z20822 Contact with and (suspected) exposure to covid-19: Secondary | ICD-10-CM | POA: Diagnosis not present

## 2020-06-19 DIAGNOSIS — Z20822 Contact with and (suspected) exposure to covid-19: Secondary | ICD-10-CM | POA: Diagnosis not present

## 2020-06-21 DIAGNOSIS — Z20822 Contact with and (suspected) exposure to covid-19: Secondary | ICD-10-CM | POA: Diagnosis not present

## 2020-06-25 DIAGNOSIS — Z20822 Contact with and (suspected) exposure to covid-19: Secondary | ICD-10-CM | POA: Diagnosis not present

## 2020-06-28 DIAGNOSIS — Z20822 Contact with and (suspected) exposure to covid-19: Secondary | ICD-10-CM | POA: Diagnosis not present

## 2020-07-05 DIAGNOSIS — Z20822 Contact with and (suspected) exposure to covid-19: Secondary | ICD-10-CM | POA: Diagnosis not present

## 2020-07-09 DIAGNOSIS — Z20822 Contact with and (suspected) exposure to covid-19: Secondary | ICD-10-CM | POA: Diagnosis not present

## 2020-07-16 DIAGNOSIS — Z20822 Contact with and (suspected) exposure to covid-19: Secondary | ICD-10-CM | POA: Diagnosis not present

## 2020-07-26 DIAGNOSIS — Z20822 Contact with and (suspected) exposure to covid-19: Secondary | ICD-10-CM | POA: Diagnosis not present

## 2020-07-30 DIAGNOSIS — Z20822 Contact with and (suspected) exposure to covid-19: Secondary | ICD-10-CM | POA: Diagnosis not present

## 2020-08-03 DIAGNOSIS — H524 Presbyopia: Secondary | ICD-10-CM | POA: Diagnosis not present

## 2020-08-03 DIAGNOSIS — Z0100 Encounter for examination of eyes and vision without abnormal findings: Secondary | ICD-10-CM | POA: Diagnosis not present

## 2020-08-06 DIAGNOSIS — Z20822 Contact with and (suspected) exposure to covid-19: Secondary | ICD-10-CM | POA: Diagnosis not present

## 2020-08-09 DIAGNOSIS — Z20822 Contact with and (suspected) exposure to covid-19: Secondary | ICD-10-CM | POA: Diagnosis not present

## 2020-08-14 DIAGNOSIS — Z20822 Contact with and (suspected) exposure to covid-19: Secondary | ICD-10-CM | POA: Diagnosis not present

## 2020-08-20 DIAGNOSIS — Z20822 Contact with and (suspected) exposure to covid-19: Secondary | ICD-10-CM | POA: Diagnosis not present

## 2020-08-27 DIAGNOSIS — Z20822 Contact with and (suspected) exposure to covid-19: Secondary | ICD-10-CM | POA: Diagnosis not present

## 2020-08-30 DIAGNOSIS — Z20822 Contact with and (suspected) exposure to covid-19: Secondary | ICD-10-CM | POA: Diagnosis not present

## 2020-09-10 DIAGNOSIS — Z1152 Encounter for screening for COVID-19: Secondary | ICD-10-CM | POA: Diagnosis not present

## 2020-09-11 DIAGNOSIS — Z20822 Contact with and (suspected) exposure to covid-19: Secondary | ICD-10-CM | POA: Diagnosis not present

## 2020-09-13 DIAGNOSIS — Z20822 Contact with and (suspected) exposure to covid-19: Secondary | ICD-10-CM | POA: Diagnosis not present

## 2020-09-18 DIAGNOSIS — Z20822 Contact with and (suspected) exposure to covid-19: Secondary | ICD-10-CM | POA: Diagnosis not present

## 2020-09-20 DIAGNOSIS — Z20822 Contact with and (suspected) exposure to covid-19: Secondary | ICD-10-CM | POA: Diagnosis not present

## 2020-09-24 DIAGNOSIS — Z20822 Contact with and (suspected) exposure to covid-19: Secondary | ICD-10-CM | POA: Diagnosis not present

## 2020-09-27 DIAGNOSIS — Z20822 Contact with and (suspected) exposure to covid-19: Secondary | ICD-10-CM | POA: Diagnosis not present

## 2020-10-01 DIAGNOSIS — Z20822 Contact with and (suspected) exposure to covid-19: Secondary | ICD-10-CM | POA: Diagnosis not present

## 2020-10-04 DIAGNOSIS — Z20822 Contact with and (suspected) exposure to covid-19: Secondary | ICD-10-CM | POA: Diagnosis not present

## 2020-10-15 DIAGNOSIS — Z20822 Contact with and (suspected) exposure to covid-19: Secondary | ICD-10-CM | POA: Diagnosis not present

## 2020-10-18 DIAGNOSIS — Z20822 Contact with and (suspected) exposure to covid-19: Secondary | ICD-10-CM | POA: Diagnosis not present

## 2020-10-22 DIAGNOSIS — Z20822 Contact with and (suspected) exposure to covid-19: Secondary | ICD-10-CM | POA: Diagnosis not present

## 2020-10-23 DIAGNOSIS — Z20822 Contact with and (suspected) exposure to covid-19: Secondary | ICD-10-CM | POA: Diagnosis not present

## 2020-10-23 DIAGNOSIS — D509 Iron deficiency anemia, unspecified: Secondary | ICD-10-CM | POA: Diagnosis not present

## 2020-10-23 DIAGNOSIS — F10129 Alcohol abuse with intoxication, unspecified: Secondary | ICD-10-CM | POA: Diagnosis not present

## 2020-10-26 DIAGNOSIS — Z1152 Encounter for screening for COVID-19: Secondary | ICD-10-CM | POA: Diagnosis not present

## 2020-10-29 ENCOUNTER — Emergency Department (HOSPITAL_COMMUNITY)
Admission: EM | Admit: 2020-10-29 | Discharge: 2020-10-29 | Disposition: A | Discharge status: Home / Self Care | Payer: Medicare HMO | Attending: Emergency Medicine | Admitting: Emergency Medicine

## 2020-10-29 ENCOUNTER — Other Ambulatory Visit: Payer: Self-pay

## 2020-10-29 DIAGNOSIS — J45909 Unspecified asthma, uncomplicated: Secondary | ICD-10-CM | POA: Insufficient documentation

## 2020-10-29 DIAGNOSIS — F1010 Alcohol abuse, uncomplicated: Secondary | ICD-10-CM | POA: Diagnosis not present

## 2020-10-29 DIAGNOSIS — R799 Abnormal finding of blood chemistry, unspecified: Secondary | ICD-10-CM | POA: Diagnosis not present

## 2020-10-29 DIAGNOSIS — Z87891 Personal history of nicotine dependence: Secondary | ICD-10-CM | POA: Diagnosis not present

## 2020-10-29 DIAGNOSIS — J449 Chronic obstructive pulmonary disease, unspecified: Secondary | ICD-10-CM | POA: Insufficient documentation

## 2020-10-29 DIAGNOSIS — R791 Abnormal coagulation profile: Secondary | ICD-10-CM | POA: Insufficient documentation

## 2020-10-29 LAB — COMPREHENSIVE METABOLIC PANEL
ALT: 26 U/L (ref 0–44)
AST: 43 U/L — ABNORMAL HIGH (ref 15–41)
Albumin: 3.8 g/dL (ref 3.5–5.0)
Alkaline Phosphatase: 55 U/L (ref 38–126)
Anion gap: 11 (ref 5–15)
BUN: 5 mg/dL — ABNORMAL LOW (ref 8–23)
CO2: 24 mmol/L (ref 22–32)
Calcium: 9.3 mg/dL (ref 8.9–10.3)
Chloride: 103 mmol/L (ref 98–111)
Creatinine, Ser: 0.79 mg/dL (ref 0.61–1.24)
GFR, Estimated: >60 mL/min (ref >60)
Glucose, Bld: 95 mg/dL (ref 70–99)
Potassium: 3.8 mmol/L (ref 3.5–5.1)
Sodium: 138 mmol/L (ref 135–145)
Total Bilirubin: 0.4 mg/dL (ref 0.3–1.2)
Total Protein: 7 g/dL (ref 6.5–8.1)

## 2020-10-29 LAB — CBC WITH DIFFERENTIAL/PLATELET
Abs Immature Granulocytes: 0.03 10*3/uL (ref 0.00–0.07)
Basophils Absolute: 0 10*3/uL (ref 0.0–0.1)
Basophils Relative: 0 %
Eosinophils Absolute: 0.1 10*3/uL (ref 0.0–0.5)
Eosinophils Relative: 3 %
HCT: 26.1 % — ABNORMAL LOW (ref 39.0–52.0)
Hemoglobin: 8 g/dL — ABNORMAL LOW (ref 13.0–17.0)
Immature Granulocytes: 1 %
Lymphocytes Relative: 26 %
Lymphs Abs: 1.3 10*3/uL (ref 0.7–4.0)
MCH: 25.6 pg — ABNORMAL LOW (ref 26.0–34.0)
MCHC: 30.7 g/dL (ref 30.0–36.0)
MCV: 83.4 fL (ref 80.0–100.0)
Monocytes Absolute: 0.6 10*3/uL (ref 0.1–1.0)
Monocytes Relative: 13 %
Neutro Abs: 2.8 10*3/uL (ref 1.7–7.7)
Neutrophils Relative %: 57 %
Platelets: 254 10*3/uL (ref 150–400)
RBC: 3.13 MIL/uL — ABNORMAL LOW (ref 4.22–5.81)
RDW: 17.9 % — ABNORMAL HIGH (ref 11.5–15.5)
WBC: 4.9 10*3/uL (ref 4.0–10.5)
nRBC: 0 % (ref 0.0–0.2)

## 2020-10-29 LAB — PROTIME-INR
INR: 1 (ref 0.8–1.2)
Prothrombin Time: 13.3 seconds (ref 11.4–15.2)

## 2020-10-29 LAB — TYPE AND SCREEN
ABO/RH(D): O POS
Antibody Screen: NEGATIVE

## 2020-10-29 NOTE — Discharge Instructions (Addendum)
Your hemoglobin today has improved from prior.  Continue taking iron therapy as directed.  Your labs are stable.  You are medically clear for inpatient rehabilitation for your alcohol use disorder

## 2020-10-29 NOTE — ED Triage Notes (Signed)
Pt reports hemoglobin of 7.4 drawn at office last week. Denies any obvious sources of bleeding, shortness of breath, or lightheadedness. Has had to have blood transfusions in the past but he states no one can find out where bleeding is coming from. Also reports needing a "clean bill of health" to get into an alcohol treatment facility.

## 2020-10-29 NOTE — ED Notes (Signed)
Unable to obtain pt's vitals, as pt stepped outside to his car. Will get vitals upon pt's return.

## 2020-10-29 NOTE — ED Provider Notes (Signed)
[facility] EMERGENCY DEPARTMENT Provider Note   CSN: [id number] Arrival date & time: 10/29/20  1318     History Chief Complaint  Patient presents with  . Abnormal Lab    Dylan Duncan is a 65 y.o. male.  65 year old male presents requesting medical clearance to go to rehab.  Seen recently at outside facility and was diagnosed hemoglobin of 7.4.  States that he has had chronic anemia for some time.  Denies any new history of GI bleeding.  Denies any hematemesis.  Did already start the iron therapy which she was prescribed.  Hemoglobin today on blood work that was done is 8.  He denies being symptomatic with this.  He has not been short of breath.  He has not had any weakness.  Last drink was yesterday.  Denies withdrawal symptoms      Past Medical History:  Diagnosis Date  . Acid reflux   . Allergy   . Asthma   . Cataract   . COPD (chronic obstructive pulmonary disease) (HCC)    ?Uses Adviar?  . Emphysema of lung (HCC)   . ETOH abuse   . GERD (gastroesophageal reflux disease)   . Hepatic steatosis   . 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 IDA (iron deficiency anemia)   . Liver fibrosis    fibrotest F4,elastography F2-F3  . Post traumatic stress disorder (PTSD)   . Substance abuse [facility])     Patient Active Problem List   Diagnosis Date Noted  . Iron deficiency anemia due to chronic blood loss 02/22/2019  . Malnutrition of moderate degree 01/21/2019  . Polyp of ascending colon   . Duodenitis   . Symptomatic anemia 01/18/2019  . GI bleed 01/18/2019  . Sprain of right knee 08/18/2018  . Asymptomatic gallstones 08/04/2012  . Steatohepatitis, HCV & EtOH 08/04/2012  . History of ETOH abuse 08/04/2012  . Portal hypertensive gastropathy (HCC) 06/27/2012  . Hematemesis 06/26/2012  . Hepatitis C   . Asthma     Past Surgical History:  Procedure Laterality Date  . arm surgery Right   . BIOPSY   01/20/2019   Procedure: BIOPSY;  Surgeon: Tressia Danas, MD;  Location: [facility] ENDOSCOPY;  Service: Gastroenterology;;  . BIOPSY  01/21/2019   Procedure: BIOPSY;  Surgeon: Tressia Danas, MD;  Location: [facility] ENDOSCOPY;  Service: Gastroenterology;;  . COLONOSCOPY WITH PROPOFOL N/A 01/21/2019   Procedure: COLONOSCOPY WITH PROPOFOL;  Surgeon: Tressia Danas, MD;  Location: [facility] ENDOSCOPY;  Service: Gastroenterology;  Laterality: N/A;  . ESOPHAGOGASTRODUODENOSCOPY N/A 06/27/2012   Procedure: ESOPHAGOGASTRODUODENOSCOPY (EGD);  Surgeon: [name], MD;  Location: [facility] ENDOSCOPY;  Service: Endoscopy;  Laterality: N/A;  . ESOPHAGOGASTRODUODENOSCOPY (EGD) WITH PROPOFOL N/A 01/20/2019   Procedure: ESOPHAGOGASTRODUODENOSCOPY (EGD) WITH PROPOFOL;  Surgeon: Tressia Danas, MD;  Location: [facility] ENDOSCOPY;  Service: Gastroenterology;  Laterality: N/A;  . GIVENS CAPSULE STUDY  01/21/2019   Procedure: GIVENS CAPSULE STUDY;  Surgeon: Tressia Danas, MD;  Location: [facility] ENDOSCOPY;  Service: Gastroenterology;;  . MANDIBLE SURGERY         Family History  Problem Relation Age of Onset  . Pneumonia Mother   . Heart disease Father        Hx of MI  . Colon cancer Neg Hx   . Esophageal cancer Neg Hx   . Liver cancer Neg Hx   . Pancreatic cancer Neg Hx   . Rectal cancer Neg Hx   .  Stomach cancer Neg Hx     Social History   Tobacco Use  . Smoking status: Former    Packs/day: 0.14    Years: 4.00    Pack years: 0.56    Types: Cigarettes    Quit date: 03/17/2010    Years since quitting: 10.6  . Smokeless tobacco: Never  Vaping Use  . Vaping Use: Never used  Substance Use Topics  . Alcohol use: Yes    Alcohol/week: 3.0 standard drinks    Types: 3 Cans of beer per week    Comment: Daily. 12 oz cans daily  . Drug use: Yes    Frequency: 2.0 times per week    Types: Marijuana    Home Medications Prior to Admission medications   Medication Sig Start Date End Date Taking? Authorizing Provider  albuterol  (PROVENTIL HFA;VENTOLIN HFA) 108 (90 Base) MCG/ACT inhaler Inhale 2 puffs into the lungs every 4 (four) hours as needed for wheezing or shortness of breath. 04/06/17   Pricilla Loveless, MD  Fluticasone-Salmeterol (ADVAIR DISKUS) 250-50 MCG/DOSE AEPB Inhale 1 puff into the lungs every 12 (twelve) hours. Patient not taking: Reported on 02/22/2019 04/06/17   Pricilla Loveless, MD    Allergies    Lactose intolerance (gi) and Penicillins  Review of Systems   Review of Systems  All other systems reviewed and are negative.  Physical Exam Updated Vital Signs BP 113/69 (BP Location: Right Arm)   Pulse 85   Temp 99.2 F (37.3 C) (Oral)   Resp 17   SpO2 100%   Physical Exam Vitals and nursing note reviewed.  Constitutional:      General: He is not in acute distress.    Appearance: Normal appearance. He is well-developed. He is not toxic-appearing.  HENT:     Head: Normocephalic and atraumatic.  Eyes:     General: Lids are normal.     Conjunctiva/sclera: Conjunctivae normal.     Pupils: Pupils are equal, round, and reactive to light.  Neck:     Thyroid: No thyroid mass.     Trachea: No tracheal deviation.  Cardiovascular:     Rate and Rhythm: Normal rate and regular rhythm.     Heart sounds: Normal heart sounds. No murmur heard.   No gallop.  Pulmonary:     Effort: Pulmonary effort is normal. No respiratory distress.     Breath sounds: Normal breath sounds. No stridor. No decreased breath sounds, wheezing, rhonchi or rales.  Abdominal:     General: There is no distension.     Palpations: Abdomen is soft.     Tenderness: There is no abdominal tenderness. There is no rebound.  Musculoskeletal:        General: No tenderness. Normal range of motion.     Cervical back: Normal range of motion and neck supple.  Skin:    General: Skin is warm and dry.     Findings: No abrasion or rash.  Neurological:     Mental Status: He is alert and oriented to person, place, and time. Mental status is  at baseline.     GCS: GCS eye subscore is 4. GCS verbal subscore is 5. GCS motor subscore is 6.     Cranial Nerves: Cranial nerves are intact. No cranial nerve deficit.     Sensory: No sensory deficit.     Motor: Motor function is intact.  Psychiatric:        Attention and Perception: Attention normal.  Speech: Speech normal.        Behavior: Behavior normal.    ED Results / Procedures / Treatments   Labs (all labs ordered are listed, but only abnormal results are displayed) Labs Reviewed  COMPREHENSIVE METABOLIC PANEL - Abnormal; Notable for the following components:      Result Value   BUN 5 (*)    AST 43 (*)    All other components within normal limits  CBC WITH DIFFERENTIAL/PLATELET - Abnormal; Notable for the following components:   RBC 3.13 (*)    Hemoglobin 8.0 (*)    HCT 26.1 (*)    MCH 25.6 (*)    RDW 17.9 (*)    All other components within normal limits  PROTIME-INR  TYPE AND SCREEN    EKG None  Radiology No results found.  Procedures Procedures   Medications Ordered in ED Medications - No data to display  ED Course  I have reviewed the triage vital signs and the nursing notes.  Pertinent labs & imaging results that were available during my care of the patient were reviewed by me and considered in my medical decision making (see chart for details).    MDM Rules/Calculators/A&P                           Patient's hemoglobin today is improved to 8.  He has no symptoms at this time.  Denies any new evidence of bleeding.  Patient's BUN and creatinine are stable.  Do not feel that he has any evidence of acute GI bleeding.  He has responded well to therapy he will continue this.  Plates are also normal.  Plan will be for patient to go to rehab as he wants to quit drinking alcohol.  He will continue with his therapy.  He will follow-up in the clinic for further evaluation of his anemia Final Clinical Impression(s) / ED Diagnoses Final diagnoses:  None     Rx / DC Orders ED Discharge Orders     None        Lorre Nick, MD 10/29/20 1946

## 2020-10-29 NOTE — ED Provider Notes (Signed)
Emergency Medicine Provider Triage Evaluation Note  MELBERT BOTELHO , a 65 y.o. male  was evaluated in triage.  Pt complains of anemia, hgb 7.4.  Review of Systems  Positive: anemia Negative: Bloody stools  Physical Exam  BP (!) 150/75 (BP Location: Left Arm)   Pulse 80   Temp 99.2 F (37.3 C) (Oral)   Resp 14   SpO2 100%  Gen:   Awake, no distress   Resp:  Normal effort  MSK:   Moves extremities without difficulty  Other:  Ambulatory   Medical Decision Making  Medically screening exam initiated at 2:02 PM.  Appropriate orders placed.  BURLEIGH BROCKMANN was informed that the remainder of the evaluation will be completed by another provider, this initial triage assessment does not replace that evaluation, and the importance of remaining in the ED until their evaluation is complete.     Karrie Meres, PA-C 10/29/20 1403    Rozelle Logan, DO 10/30/20 1509

## 2020-10-31 ENCOUNTER — Ambulatory Visit (HOSPITAL_COMMUNITY)
Admission: EM | Admit: 2020-10-31 | Discharge: 2020-10-31 | Disposition: A | Discharge status: Home / Self Care | Payer: Medicare HMO | Attending: Psychiatry | Admitting: Psychiatry

## 2020-10-31 ENCOUNTER — Other Ambulatory Visit: Payer: Self-pay

## 2020-10-31 DIAGNOSIS — F1020 Alcohol dependence, uncomplicated: Secondary | ICD-10-CM | POA: Diagnosis not present

## 2020-10-31 DIAGNOSIS — F4310 Post-traumatic stress disorder, unspecified: Secondary | ICD-10-CM | POA: Insufficient documentation

## 2020-10-31 DIAGNOSIS — Z8659 Personal history of other mental and behavioral disorders: Secondary | ICD-10-CM | POA: Insufficient documentation

## 2020-10-31 DIAGNOSIS — Z20822 Contact with and (suspected) exposure to covid-19: Secondary | ICD-10-CM | POA: Insufficient documentation

## 2020-10-31 LAB — POC SARS CORONAVIRUS 2 AG -  ED: SARS Coronavirus 2 Ag: NEGATIVE

## 2020-10-31 LAB — POC SARS CORONAVIRUS 2 AG: SARSCOV2ONAVIRUS 2 AG: NEGATIVE

## 2020-10-31 NOTE — Progress Notes (Signed)
Pt requesting referral to residential treatment for Alcohol Use Disorder, CSW provided information on [address] and [address], [employer]. [facility] currently has openings however pt reported that he has a court date in 11/16/20 and declined. [facility] shared with pt that they would be able to work with him on the court date and the court system finds it favorable when people seek help on their own for substance use. Pt continued to declined service and reported that he may go to [facility] for possible detox, although he presented hospital discharge paperwork that he was medically cleared and did not require detox. Pt given resources in AVS.

## 2020-10-31 NOTE — ED Provider Notes (Signed)
Behavioral Health Urgent Care Medical Screening Exam  Patient Name: Dylan Duncan MRN: [id number] Date of Evaluation: 10/31/20 Chief Complaint:  Alcohol Detoxification Diagnosis:  Final diagnoses:  Alcohol use disorder, moderate, dependence (HCC)    History of Present illness: Dylan Duncan is a 65 y.o. male with history of PTSD and alcohol use disorders-severe presents to the [facility] to request and with alcohol detoxification prior to his court date, November 23, 2020 for DUI.  Patient states he has been "kicked around" multiple rehabilitation programs but states that he does not meet qualifications because his alcohol level is either too high or too low to meet qualifications.  Patient is very frustrated about this as he simply wants to get help and that this point he would "rather just go to jail".   Pt stated he had a hx of PTSD that was diagnosed over a decade ago when he saw a psychiatrist in [place] but denies presently having nightmares, flashbacks/dissociations, hypervigilance, intrusive thoughts.Pt is not presently taking any psychiatric medications. Pt has been sleeping poorly due to going to various locations to try to get into rehab program.   Patient denies present SI/HI/AVH  Psychiatric Specialty Exam  Presentation  General Appearance:Appropriate for Environment Eye Contact:Good Speech:Normal Rate Speech Volume:Normal Handedness:No data recorded  Mood and Affect  Mood: Anxious; Hopeless Affect: Appropriate  Thought Process  Thought Processes: Coherent Descriptions of Associations:Intact Orientation:Full (Time, Place and Person) Thought Content:Logical   Hallucinations:None Ideas of Reference:None Suicidal Thoughts:No Homicidal Thoughts:No  Sensorium  Memory: Immediate Good; Recent Good; Remote Good Judgment: Good Insight: Good  Executive Functions  Concentration: Fair Attention Span: Fair Recall: Fair Fund of  Knowledge: Fair Language: Fair  Psychomotor Activity  Psychomotor Activity: Normal  Assets  Assets: Desire for Improvement; Communication Skills  Sleep  Sleep: Fair Number of hours:  No data recorded  No data recorded  Physical Exam: Physical Exam Vitals and nursing note reviewed.  Constitutional:      Appearance: He is well-developed.  HENT:     Head: Normocephalic and atraumatic.  Eyes:     Conjunctiva/sclera: Conjunctivae normal.  Cardiovascular:     Rate and Rhythm: Normal rate and regular rhythm.     Heart sounds: No murmur heard. Pulmonary:     Effort: Pulmonary effort is normal. No respiratory distress.     Breath sounds: Normal breath sounds.  Abdominal:     Palpations: Abdomen is soft.     Tenderness: no abdominal tenderness  Musculoskeletal:     Cervical back: Neck supple.  Skin:    General: Skin is warm and dry.  Neurological:     Mental Status: He is alert.   Review of Systems  Constitutional:  Negative for chills and fever.  Respiratory:  Negative for cough, shortness of breath and wheezing.   Cardiovascular:  Negative for chest pain and palpitations.  Gastrointestinal:  Negative for abdominal pain, nausea and vomiting.  Skin:  Negative for itching and rash.  Neurological:  Negative for dizziness and headaches.  Blood pressure 112/73, pulse 73, temperature 98.7 F (37.1 C), temperature source Oral, resp. rate 16, SpO2 100 %. There is no height or weight on file to calculate BMI.  Musculoskeletal: Strength & Muscle Tone: within normal limits Gait & Station: normal Patient leans: Front   [facility] MSE Discharge Disposition for Follow up and Recommendations: Based on my evaluation the patient does not appear to have an emergency medical condition and can be discharged with resources and follow up care  in outpatient services for Substance Abuse Intensive Outpatient Program  Recommended to [facility] treatment facility for  detox.   Park Pope, MD 10/31/2020, 8:52 AM

## 2020-10-31 NOTE — BH Assessment (Signed)
TTS triage: Patient presents to [facility] requesting assistance with treatment for alcohol use prior to his next court date on 9/2. He states he has been to multiple facilities attempting to get into detox or a treatment program. He states he either doesn't have enough alcohol in his system or too much. He denies SI/HI/AVH. He denies any history of D.T.s or seizures. His last drink was 2 days ago.  Patient is routine.

## 2020-10-31 NOTE — ED Notes (Signed)
Pt discharged home with AVS.  AVS reviewed prior to discharge.  Pt alert, oriented, and ambulatory.  Safety maintained. 

## 2020-11-01 DIAGNOSIS — F1020 Alcohol dependence, uncomplicated: Secondary | ICD-10-CM | POA: Diagnosis not present

## 2020-11-01 DIAGNOSIS — F1223 Cannabis dependence with withdrawal: Secondary | ICD-10-CM | POA: Diagnosis not present

## 2020-11-02 DIAGNOSIS — F1020 Alcohol dependence, uncomplicated: Secondary | ICD-10-CM | POA: Diagnosis not present

## 2020-11-03 DIAGNOSIS — F1020 Alcohol dependence, uncomplicated: Secondary | ICD-10-CM | POA: Diagnosis not present

## 2020-11-04 DIAGNOSIS — F1020 Alcohol dependence, uncomplicated: Secondary | ICD-10-CM | POA: Diagnosis not present

## 2020-11-05 DIAGNOSIS — F1020 Alcohol dependence, uncomplicated: Secondary | ICD-10-CM | POA: Diagnosis not present

## 2020-11-06 DIAGNOSIS — F1020 Alcohol dependence, uncomplicated: Secondary | ICD-10-CM | POA: Diagnosis not present

## 2020-11-07 DIAGNOSIS — F1020 Alcohol dependence, uncomplicated: Secondary | ICD-10-CM | POA: Diagnosis not present

## 2020-11-15 DIAGNOSIS — Z20822 Contact with and (suspected) exposure to covid-19: Secondary | ICD-10-CM | POA: Diagnosis not present

## 2020-11-23 ENCOUNTER — Encounter: Payer: Self-pay | Admitting: *Deleted

## 2020-11-23 NOTE — Congregational Nurse Program (Signed)
  Dept: [phone]   Congregational Nurse Program Note  Date of Encounter: 11/23/2020  Past Medical History: Past Medical History:  Diagnosis Date   Acid reflux    Allergy    Asthma    Cataract    COPD (chronic obstructive pulmonary disease) (HCC)    ?Uses Adviar?   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 Yes    Visit Setting Church or Organization    Location Patient Served At [facility]    Patient Status Not Applicable    Medical Provider No    Insurance Medicare    Intervention Support;Refer    Referrals PCP - other provider;Other   [facility], AA meetings           Client came to [facility] asking for help with substance abuse support programs. Client has a court date coming up at the end of the month and recently completed 7 day detox at [facility]. He is currently in classes 9-1 and working at [employer]. He went to [facility] earlier this morning and says he needs to have a PCP before he can attend an afternoon program with them. Referred to [facility] for PCP. Discussed local AA meetings. Genora Arp W RN CN [phone]

## 2020-11-26 DIAGNOSIS — Z20822 Contact with and (suspected) exposure to covid-19: Secondary | ICD-10-CM | POA: Diagnosis not present

## 2020-11-30 DIAGNOSIS — F1020 Alcohol dependence, uncomplicated: Secondary | ICD-10-CM | POA: Diagnosis not present

## 2020-11-30 DIAGNOSIS — Z7689 Persons encountering health services in other specified circumstances: Secondary | ICD-10-CM | POA: Diagnosis not present

## 2020-12-07 DIAGNOSIS — Z20822 Contact with and (suspected) exposure to covid-19: Secondary | ICD-10-CM | POA: Diagnosis not present

## 2020-12-12 DIAGNOSIS — Z1152 Encounter for screening for COVID-19: Secondary | ICD-10-CM | POA: Diagnosis not present

## 2020-12-18 DIAGNOSIS — Z1152 Encounter for screening for COVID-19: Secondary | ICD-10-CM | POA: Diagnosis not present

## 2021-02-20 DIAGNOSIS — Z1152 Encounter for screening for COVID-19: Secondary | ICD-10-CM | POA: Diagnosis not present

## 2021-02-26 DIAGNOSIS — Z1152 Encounter for screening for COVID-19: Secondary | ICD-10-CM | POA: Diagnosis not present

## 2021-03-05 DIAGNOSIS — Z1152 Encounter for screening for COVID-19: Secondary | ICD-10-CM | POA: Diagnosis not present

## 2021-03-27 DIAGNOSIS — Z1152 Encounter for screening for COVID-19: Secondary | ICD-10-CM | POA: Diagnosis not present

## 2021-03-28 DIAGNOSIS — F1020 Alcohol dependence, uncomplicated: Secondary | ICD-10-CM | POA: Diagnosis not present

## 2021-04-03 DIAGNOSIS — F1020 Alcohol dependence, uncomplicated: Secondary | ICD-10-CM | POA: Diagnosis not present

## 2021-04-07 ENCOUNTER — Emergency Department (HOSPITAL_COMMUNITY)
Admission: EM | Admit: 2021-04-07 | Discharge: 2021-04-07 | Discharge status: Against Medical Advice | Payer: Medicare HMO | Attending: Emergency Medicine | Admitting: Emergency Medicine

## 2021-04-07 ENCOUNTER — Other Ambulatory Visit: Payer: Self-pay

## 2021-04-07 ENCOUNTER — Emergency Department (HOSPITAL_COMMUNITY): Payer: Medicare HMO

## 2021-04-07 ENCOUNTER — Encounter (HOSPITAL_COMMUNITY): Payer: Self-pay | Admitting: Emergency Medicine

## 2021-04-07 DIAGNOSIS — Z5321 Procedure and treatment not carried out due to patient leaving prior to being seen by health care provider: Secondary | ICD-10-CM | POA: Diagnosis not present

## 2021-04-07 DIAGNOSIS — M25551 Pain in right hip: Secondary | ICD-10-CM | POA: Diagnosis not present

## 2021-04-07 NOTE — ED Triage Notes (Signed)
Patient complaint of right hip pain and popping that has been occurring off and on for 6 months. VSS. NAD.

## 2021-04-07 NOTE — ED Notes (Signed)
Called pt x3 for room, no response. 

## 2021-04-09 DIAGNOSIS — Z13220 Encounter for screening for lipoid disorders: Secondary | ICD-10-CM | POA: Diagnosis not present

## 2021-04-09 DIAGNOSIS — D638 Anemia in other chronic diseases classified elsewhere: Secondary | ICD-10-CM | POA: Diagnosis not present

## 2021-04-09 DIAGNOSIS — J440 Chronic obstructive pulmonary disease with acute lower respiratory infection: Secondary | ICD-10-CM | POA: Diagnosis not present

## 2021-04-09 DIAGNOSIS — J4520 Mild intermittent asthma, uncomplicated: Secondary | ICD-10-CM | POA: Diagnosis not present

## 2021-04-09 DIAGNOSIS — Z131 Encounter for screening for diabetes mellitus: Secondary | ICD-10-CM | POA: Diagnosis not present

## 2021-04-09 DIAGNOSIS — Z125 Encounter for screening for malignant neoplasm of prostate: Secondary | ICD-10-CM | POA: Diagnosis not present

## 2021-04-09 DIAGNOSIS — M169 Osteoarthritis of hip, unspecified: Secondary | ICD-10-CM | POA: Diagnosis not present

## 2021-04-18 DIAGNOSIS — R972 Elevated prostate specific antigen [PSA]: Secondary | ICD-10-CM | POA: Diagnosis not present

## 2021-04-18 DIAGNOSIS — N401 Enlarged prostate with lower urinary tract symptoms: Secondary | ICD-10-CM | POA: Diagnosis not present

## 2021-04-18 DIAGNOSIS — R350 Frequency of micturition: Secondary | ICD-10-CM | POA: Diagnosis not present

## 2021-04-22 ENCOUNTER — Other Ambulatory Visit: Payer: Self-pay | Admitting: Orthopedic Surgery

## 2021-04-22 DIAGNOSIS — M25521 Pain in right elbow: Secondary | ICD-10-CM | POA: Diagnosis not present

## 2021-05-03 DIAGNOSIS — Z20822 Contact with and (suspected) exposure to covid-19: Secondary | ICD-10-CM | POA: Diagnosis not present

## 2021-05-09 DIAGNOSIS — Z1152 Encounter for screening for COVID-19: Secondary | ICD-10-CM | POA: Diagnosis not present

## 2021-05-14 ENCOUNTER — Other Ambulatory Visit: Payer: Self-pay

## 2021-05-14 ENCOUNTER — Ambulatory Visit
Admission: RE | Admit: 2021-05-14 | Discharge: 2021-05-14 | Disposition: A | Discharge status: Home / Self Care | Payer: Medicare HMO | Source: Ambulatory Visit | Attending: Orthopedic Surgery | Admitting: Orthopedic Surgery

## 2021-05-14 DIAGNOSIS — M778 Other enthesopathies, not elsewhere classified: Secondary | ICD-10-CM | POA: Diagnosis not present

## 2021-05-14 DIAGNOSIS — M25521 Pain in right elbow: Secondary | ICD-10-CM

## 2021-05-14 DIAGNOSIS — Z1152 Encounter for screening for COVID-19: Secondary | ICD-10-CM | POA: Diagnosis not present

## 2021-05-14 DIAGNOSIS — M24021 Loose body in right elbow: Secondary | ICD-10-CM | POA: Diagnosis not present

## 2021-05-17 DIAGNOSIS — M25521 Pain in right elbow: Secondary | ICD-10-CM | POA: Diagnosis not present

## 2021-05-21 DIAGNOSIS — Z1152 Encounter for screening for COVID-19: Secondary | ICD-10-CM | POA: Diagnosis not present

## 2021-06-06 DIAGNOSIS — Z1152 Encounter for screening for COVID-19: Secondary | ICD-10-CM | POA: Diagnosis not present

## 2021-06-17 DIAGNOSIS — M24621 Ankylosis, right elbow: Secondary | ICD-10-CM | POA: Diagnosis not present

## 2021-06-17 DIAGNOSIS — M67321 Transient synovitis, right elbow: Secondary | ICD-10-CM | POA: Diagnosis not present

## 2021-06-17 DIAGNOSIS — G5621 Lesion of ulnar nerve, right upper limb: Secondary | ICD-10-CM | POA: Diagnosis not present

## 2021-06-17 DIAGNOSIS — M24521 Contracture, right elbow: Secondary | ICD-10-CM | POA: Diagnosis not present

## 2021-06-17 DIAGNOSIS — M85831 Other specified disorders of bone density and structure, right forearm: Secondary | ICD-10-CM | POA: Diagnosis not present

## 2021-06-18 DIAGNOSIS — G5621 Lesion of ulnar nerve, right upper limb: Secondary | ICD-10-CM | POA: Diagnosis not present

## 2021-06-20 DIAGNOSIS — G5621 Lesion of ulnar nerve, right upper limb: Secondary | ICD-10-CM | POA: Diagnosis not present

## 2021-06-25 DIAGNOSIS — G5621 Lesion of ulnar nerve, right upper limb: Secondary | ICD-10-CM | POA: Diagnosis not present

## 2021-06-26 DIAGNOSIS — Z1152 Encounter for screening for COVID-19: Secondary | ICD-10-CM | POA: Diagnosis not present

## 2021-06-27 DIAGNOSIS — M6281 Muscle weakness (generalized): Secondary | ICD-10-CM | POA: Diagnosis not present

## 2021-06-27 DIAGNOSIS — M25421 Effusion, right elbow: Secondary | ICD-10-CM | POA: Diagnosis not present

## 2021-06-27 DIAGNOSIS — S5401XD Injury of ulnar nerve at forearm level, right arm, subsequent encounter: Secondary | ICD-10-CM | POA: Diagnosis not present

## 2021-06-27 DIAGNOSIS — M25621 Stiffness of right elbow, not elsewhere classified: Secondary | ICD-10-CM | POA: Diagnosis not present

## 2021-07-01 DIAGNOSIS — M25621 Stiffness of right elbow, not elsewhere classified: Secondary | ICD-10-CM | POA: Diagnosis not present

## 2021-07-01 DIAGNOSIS — M6281 Muscle weakness (generalized): Secondary | ICD-10-CM | POA: Diagnosis not present

## 2021-07-01 DIAGNOSIS — M25421 Effusion, right elbow: Secondary | ICD-10-CM | POA: Diagnosis not present

## 2021-07-01 DIAGNOSIS — S5401XD Injury of ulnar nerve at forearm level, right arm, subsequent encounter: Secondary | ICD-10-CM | POA: Diagnosis not present

## 2021-07-01 DIAGNOSIS — Z1152 Encounter for screening for COVID-19: Secondary | ICD-10-CM | POA: Diagnosis not present

## 2021-07-03 DIAGNOSIS — M14821 Arthropathies in other specified diseases classified elsewhere, right elbow: Secondary | ICD-10-CM | POA: Diagnosis not present

## 2021-07-03 DIAGNOSIS — J302 Other seasonal allergic rhinitis: Secondary | ICD-10-CM | POA: Diagnosis not present

## 2021-07-03 DIAGNOSIS — J440 Chronic obstructive pulmonary disease with acute lower respiratory infection: Secondary | ICD-10-CM | POA: Diagnosis not present

## 2021-07-03 DIAGNOSIS — R972 Elevated prostate specific antigen [PSA]: Secondary | ICD-10-CM | POA: Diagnosis not present

## 2021-07-03 DIAGNOSIS — Z1152 Encounter for screening for COVID-19: Secondary | ICD-10-CM | POA: Diagnosis not present

## 2021-07-03 DIAGNOSIS — D500 Iron deficiency anemia secondary to blood loss (chronic): Secondary | ICD-10-CM | POA: Diagnosis not present

## 2021-07-04 DIAGNOSIS — M25621 Stiffness of right elbow, not elsewhere classified: Secondary | ICD-10-CM | POA: Diagnosis not present

## 2021-07-04 DIAGNOSIS — S5401XD Injury of ulnar nerve at forearm level, right arm, subsequent encounter: Secondary | ICD-10-CM | POA: Diagnosis not present

## 2021-07-04 DIAGNOSIS — M6281 Muscle weakness (generalized): Secondary | ICD-10-CM | POA: Diagnosis not present

## 2021-07-04 DIAGNOSIS — M25421 Effusion, right elbow: Secondary | ICD-10-CM | POA: Diagnosis not present

## 2021-07-09 DIAGNOSIS — M6281 Muscle weakness (generalized): Secondary | ICD-10-CM | POA: Diagnosis not present

## 2021-07-09 DIAGNOSIS — S5401XD Injury of ulnar nerve at forearm level, right arm, subsequent encounter: Secondary | ICD-10-CM | POA: Diagnosis not present

## 2021-07-09 DIAGNOSIS — M25621 Stiffness of right elbow, not elsewhere classified: Secondary | ICD-10-CM | POA: Diagnosis not present

## 2021-07-09 DIAGNOSIS — M25421 Effusion, right elbow: Secondary | ICD-10-CM | POA: Diagnosis not present

## 2021-07-10 DIAGNOSIS — Z1152 Encounter for screening for COVID-19: Secondary | ICD-10-CM | POA: Diagnosis not present

## 2021-07-11 DIAGNOSIS — Z20822 Contact with and (suspected) exposure to covid-19: Secondary | ICD-10-CM | POA: Diagnosis not present

## 2021-07-12 ENCOUNTER — Other Ambulatory Visit: Payer: Self-pay | Admitting: Nurse Practitioner

## 2021-07-12 ENCOUNTER — Other Ambulatory Visit (HOSPITAL_COMMUNITY): Payer: Self-pay | Admitting: Nurse Practitioner

## 2021-07-12 DIAGNOSIS — R972 Elevated prostate specific antigen [PSA]: Secondary | ICD-10-CM

## 2021-07-17 DIAGNOSIS — Z1152 Encounter for screening for COVID-19: Secondary | ICD-10-CM | POA: Diagnosis not present

## 2021-07-18 DIAGNOSIS — S5401XD Injury of ulnar nerve at forearm level, right arm, subsequent encounter: Secondary | ICD-10-CM | POA: Diagnosis not present

## 2021-07-18 DIAGNOSIS — M25621 Stiffness of right elbow, not elsewhere classified: Secondary | ICD-10-CM | POA: Diagnosis not present

## 2021-07-18 DIAGNOSIS — R972 Elevated prostate specific antigen [PSA]: Secondary | ICD-10-CM | POA: Diagnosis not present

## 2021-07-18 DIAGNOSIS — M6281 Muscle weakness (generalized): Secondary | ICD-10-CM | POA: Diagnosis not present

## 2021-07-18 DIAGNOSIS — J440 Chronic obstructive pulmonary disease with acute lower respiratory infection: Secondary | ICD-10-CM | POA: Diagnosis not present

## 2021-07-18 DIAGNOSIS — M25421 Effusion, right elbow: Secondary | ICD-10-CM | POA: Diagnosis not present

## 2021-07-24 DIAGNOSIS — Z1152 Encounter for screening for COVID-19: Secondary | ICD-10-CM | POA: Diagnosis not present

## 2021-07-25 DIAGNOSIS — M6281 Muscle weakness (generalized): Secondary | ICD-10-CM | POA: Diagnosis not present

## 2021-07-25 DIAGNOSIS — S5401XD Injury of ulnar nerve at forearm level, right arm, subsequent encounter: Secondary | ICD-10-CM | POA: Diagnosis not present

## 2021-07-25 DIAGNOSIS — M25421 Effusion, right elbow: Secondary | ICD-10-CM | POA: Diagnosis not present

## 2021-07-25 DIAGNOSIS — M25621 Stiffness of right elbow, not elsewhere classified: Secondary | ICD-10-CM | POA: Diagnosis not present

## 2021-07-26 ENCOUNTER — Ambulatory Visit (HOSPITAL_COMMUNITY)
Admission: RE | Admit: 2021-07-26 | Discharge: 2021-07-26 | Disposition: A | Discharge status: Home / Self Care | Payer: Medicare HMO | Source: Ambulatory Visit | Attending: Nurse Practitioner | Admitting: Nurse Practitioner

## 2021-07-26 DIAGNOSIS — R972 Elevated prostate specific antigen [PSA]: Secondary | ICD-10-CM | POA: Insufficient documentation

## 2021-07-26 DIAGNOSIS — R590 Localized enlarged lymph nodes: Secondary | ICD-10-CM | POA: Diagnosis not present

## 2021-07-26 MED ORDER — GADOBUTROL 1 MMOL/ML IV SOLN
5.0000 mL | Freq: Once | INTRAVENOUS | Status: AC | PRN
  Administered 2021-07-26: 5 mL via INTRAVENOUS

## 2021-08-01 DIAGNOSIS — S5401XD Injury of ulnar nerve at forearm level, right arm, subsequent encounter: Secondary | ICD-10-CM | POA: Diagnosis not present

## 2021-08-01 DIAGNOSIS — M25421 Effusion, right elbow: Secondary | ICD-10-CM | POA: Diagnosis not present

## 2021-08-01 DIAGNOSIS — M25621 Stiffness of right elbow, not elsewhere classified: Secondary | ICD-10-CM | POA: Diagnosis not present

## 2021-08-01 DIAGNOSIS — M6281 Muscle weakness (generalized): Secondary | ICD-10-CM | POA: Diagnosis not present

## 2021-08-06 DIAGNOSIS — Z1152 Encounter for screening for COVID-19: Secondary | ICD-10-CM | POA: Diagnosis not present

## 2021-08-07 DIAGNOSIS — Z1152 Encounter for screening for COVID-19: Secondary | ICD-10-CM | POA: Diagnosis not present

## 2021-08-27 DIAGNOSIS — Z20822 Contact with and (suspected) exposure to covid-19: Secondary | ICD-10-CM | POA: Diagnosis not present

## 2021-09-03 DIAGNOSIS — Z1152 Encounter for screening for COVID-19: Secondary | ICD-10-CM | POA: Diagnosis not present

## 2021-09-16 DIAGNOSIS — Z1152 Encounter for screening for COVID-19: Secondary | ICD-10-CM | POA: Diagnosis not present

## 2021-09-30 DIAGNOSIS — Z20822 Contact with and (suspected) exposure to covid-19: Secondary | ICD-10-CM | POA: Diagnosis not present

## 2021-10-03 DIAGNOSIS — D638 Anemia in other chronic diseases classified elsewhere: Secondary | ICD-10-CM | POA: Diagnosis not present

## 2021-10-03 DIAGNOSIS — N139 Obstructive and reflux uropathy, unspecified: Secondary | ICD-10-CM | POA: Diagnosis not present

## 2021-10-03 DIAGNOSIS — R972 Elevated prostate specific antigen [PSA]: Secondary | ICD-10-CM | POA: Diagnosis not present

## 2021-10-03 DIAGNOSIS — J440 Chronic obstructive pulmonary disease with acute lower respiratory infection: Secondary | ICD-10-CM | POA: Diagnosis not present

## 2021-10-14 DIAGNOSIS — Z20822 Contact with and (suspected) exposure to covid-19: Secondary | ICD-10-CM | POA: Diagnosis not present

## 2021-10-21 DIAGNOSIS — Z20822 Contact with and (suspected) exposure to covid-19: Secondary | ICD-10-CM | POA: Diagnosis not present

## 2021-11-26 DIAGNOSIS — Z20822 Contact with and (suspected) exposure to covid-19: Secondary | ICD-10-CM | POA: Diagnosis not present

## 2022-02-05 DIAGNOSIS — F1020 Alcohol dependence, uncomplicated: Secondary | ICD-10-CM | POA: Diagnosis not present

## 2022-02-06 DIAGNOSIS — F1020 Alcohol dependence, uncomplicated: Secondary | ICD-10-CM | POA: Diagnosis not present

## 2022-02-07 DIAGNOSIS — F1020 Alcohol dependence, uncomplicated: Secondary | ICD-10-CM | POA: Diagnosis not present

## 2022-02-08 DIAGNOSIS — F1020 Alcohol dependence, uncomplicated: Secondary | ICD-10-CM | POA: Diagnosis not present

## 2022-02-09 DIAGNOSIS — F1020 Alcohol dependence, uncomplicated: Secondary | ICD-10-CM | POA: Diagnosis not present

## 2022-02-10 DIAGNOSIS — F1020 Alcohol dependence, uncomplicated: Secondary | ICD-10-CM | POA: Diagnosis not present

## 2022-04-10 DIAGNOSIS — H6123 Impacted cerumen, bilateral: Secondary | ICD-10-CM | POA: Diagnosis not present

## 2022-04-10 DIAGNOSIS — J302 Other seasonal allergic rhinitis: Secondary | ICD-10-CM | POA: Diagnosis not present

## 2022-04-10 DIAGNOSIS — T162XXA Foreign body in left ear, initial encounter: Secondary | ICD-10-CM | POA: Diagnosis not present

## 2022-05-29 DIAGNOSIS — Z131 Encounter for screening for diabetes mellitus: Secondary | ICD-10-CM | POA: Diagnosis not present

## 2022-05-29 DIAGNOSIS — R972 Elevated prostate specific antigen [PSA]: Secondary | ICD-10-CM | POA: Diagnosis not present

## 2022-05-29 DIAGNOSIS — Z1329 Encounter for screening for other suspected endocrine disorder: Secondary | ICD-10-CM | POA: Diagnosis not present

## 2022-05-29 DIAGNOSIS — Z13220 Encounter for screening for lipoid disorders: Secondary | ICD-10-CM | POA: Diagnosis not present

## 2022-05-29 DIAGNOSIS — Z130 Encounter for screening for diseases of the blood and blood-forming organs and certain disorders involving the immune mechanism: Secondary | ICD-10-CM | POA: Diagnosis not present

## 2022-05-29 DIAGNOSIS — D500 Iron deficiency anemia secondary to blood loss (chronic): Secondary | ICD-10-CM | POA: Diagnosis not present

## 2022-05-29 DIAGNOSIS — Z113 Encounter for screening for infections with a predominantly sexual mode of transmission: Secondary | ICD-10-CM | POA: Diagnosis not present

## 2022-05-29 DIAGNOSIS — Z1159 Encounter for screening for other viral diseases: Secondary | ICD-10-CM | POA: Diagnosis not present

## 2022-05-29 DIAGNOSIS — Z1321 Encounter for screening for nutritional disorder: Secondary | ICD-10-CM | POA: Diagnosis not present

## 2022-06-26 ENCOUNTER — Other Ambulatory Visit: Payer: Self-pay

## 2022-06-26 DIAGNOSIS — Z136 Encounter for screening for cardiovascular disorders: Secondary | ICD-10-CM

## 2022-06-26 DIAGNOSIS — R972 Elevated prostate specific antigen [PSA]: Secondary | ICD-10-CM | POA: Diagnosis not present

## 2022-06-26 DIAGNOSIS — Z87891 Personal history of nicotine dependence: Secondary | ICD-10-CM

## 2022-06-26 DIAGNOSIS — Z9189 Other specified personal risk factors, not elsewhere classified: Secondary | ICD-10-CM | POA: Diagnosis not present

## 2022-06-26 DIAGNOSIS — H547 Unspecified visual loss: Secondary | ICD-10-CM | POA: Diagnosis not present

## 2022-06-26 DIAGNOSIS — Z789 Other specified health status: Secondary | ICD-10-CM | POA: Diagnosis not present

## 2022-07-11 ENCOUNTER — Ambulatory Visit
Admission: RE | Admit: 2022-07-11 | Discharge: 2022-07-11 | Disposition: A | Discharge status: Home / Self Care | Payer: Medicare HMO | Source: Ambulatory Visit | Attending: Family | Admitting: Family

## 2022-07-11 DIAGNOSIS — Z87891 Personal history of nicotine dependence: Secondary | ICD-10-CM

## 2022-07-11 DIAGNOSIS — Z136 Encounter for screening for cardiovascular disorders: Secondary | ICD-10-CM

## 2022-07-28 DIAGNOSIS — H6123 Impacted cerumen, bilateral: Secondary | ICD-10-CM | POA: Diagnosis not present

## 2023-02-27 IMAGING — MR MR PROSTATE WO/W CM
15 series · 48 of 48 positions shown · IV contrast (gadavist)
Comparison: None Available.

CLINICAL DATA: Elevated PSA levels.

EXAM:
MR PROSTATE WITHOUT AND WITH CONTRAST
TECHNIQUE: Multiplanar multisequence MRI images were obtained of the pelvis
centered about the prostate. Pre and post contrast images were
obtained. The patient refused endorectal coil.
CONTRAST:  5mL GADAVIST GADOBUTROL 1 MMOL/ML IV SOLN

[Series 3: T1 · axial · 5.0mm · 1.00mm/px · 1 of 60 slices shown (1 of 2)]
[im 1/60]
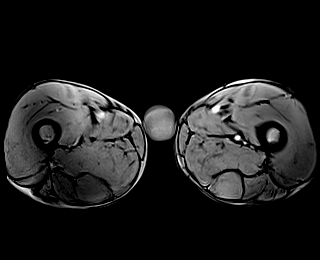

[Series 4: T1 · axial · 5.0mm · 1.00mm/px · 1 of 60 slices shown (2 of 2)]
[im 1/60]
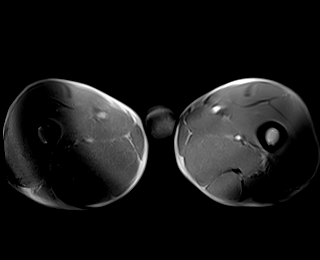

[Series 5: T2 · axial · 3.0mm · 0.59mm/px · 1 of 30 slices shown (1 of 4)]
[im 1/30]
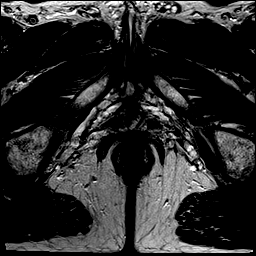

[Series 6: T2 · coronal · 3.0mm · 0.59mm/px · 1 of 27 slices shown (2 of 4)]
[im 1/27]
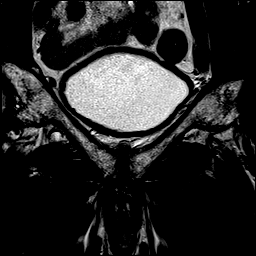

[Series 7: T2 · axial · 1.0mm · 0.90mm/px · z∈[+17,+104]mm · 2 of 88 slices shown (3 of 4)]
[im 1/88]
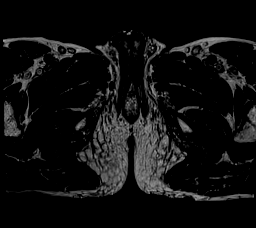
[im 88/88]
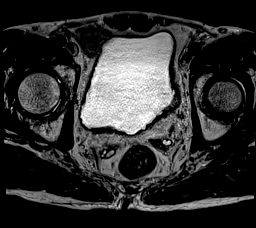

[Series 8: T2 · sagittal · 3.0mm · 0.59mm/px · 1 of 24 slices shown (4 of 4)]
[im 1/24]
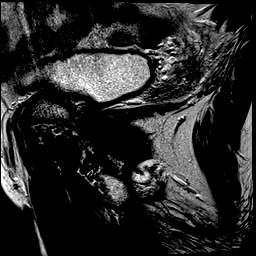

[Series 9: b 50_(id) dwi_tracew_dfc_mix · axial · 3.0mm · 1.95mm/px · 1 of 50 slices shown]
[im 1/50]
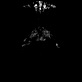

[Series 10: b 50_(id) dwi_adc_dfc_mix · axial · 3.0mm · 1.95mm/px · 1 of 25 slices shown]
[im 1/25]
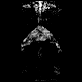

[Series 11: b 50_(id) dwi_calc_bval_dfc_mix · axial · 3.0mm · 1.95mm/px · 1 of 25 slices shown]
[im 1/25]
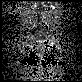

[Series 12: b_50_500_(id) dwi_tracew_dfc_mix · axial · 3.0mm · 1.95mm/px · z∈[+39,+111]mm · 2 of 75 slices shown]
[im 1/75]
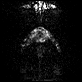
[im 75/75]
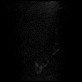

[Series 13: b_50_500_(id) dwi_adc_dfc_mix · axial · 3.0mm · 1.95mm/px · 1 of 25 slices shown]
[im 1/25]
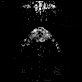

[Series 14: b_50_500_(id) dwi_calc_bval_dfc_mix · axial · 3.0mm · 1.95mm/px · 1 of 25 slices shown]
[im 1/25]
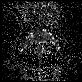

[Series 15: t1_vibe_tra_dyn · axial · 3.0mm · 0.98mm/px · z∈[+28,+109]mm · 16 of 560 slices shown]
[im 1/560]
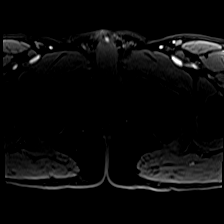
[im 38/560]
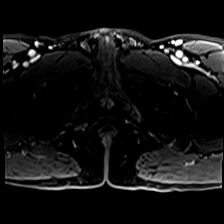
[im 75/560]
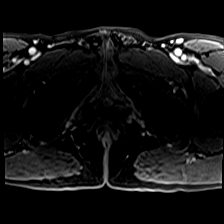
[im 112/560]
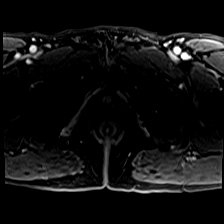
[im 150/560]
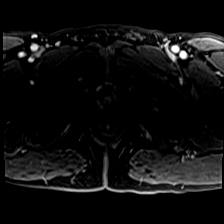
[im 187/560]
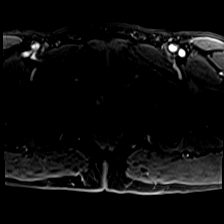
[im 224/560]
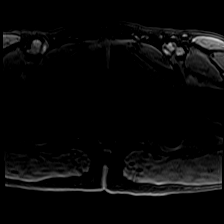
[im 261/560]
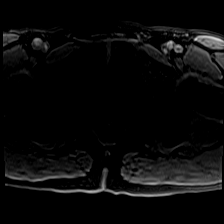
[im 299/560]
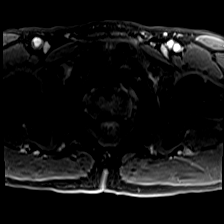
[im 336/560]
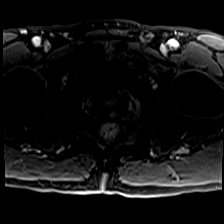
[im 373/560]
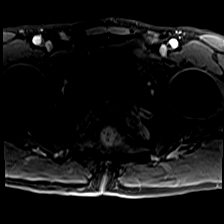
[im 410/560]
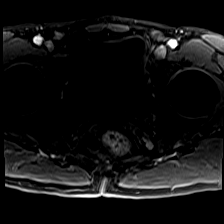
[im 448/560]
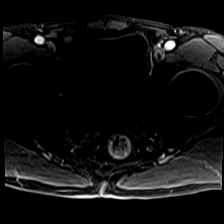
[im 485/560]
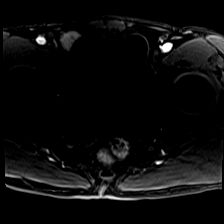
[im 522/560]
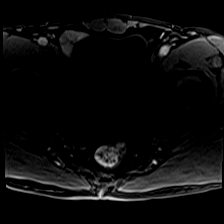
[im 560/560]
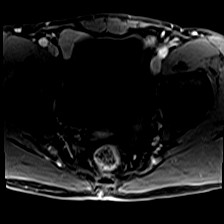

[Series 16: t1_vibe_tra_dyn_sub · axial · 3.0mm · 0.98mm/px · z∈[+28,+109]mm · 15 of 532 slices shown]
[im 1/532]
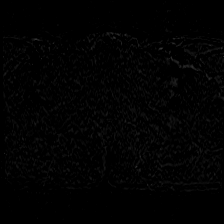
[im 38/532]
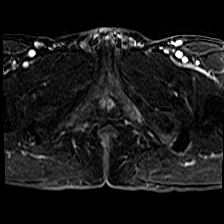
[im 76/532]
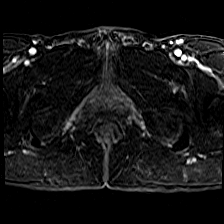
[im 114/532]
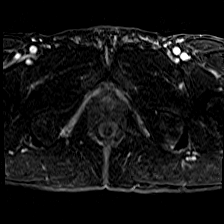
[im 152/532]
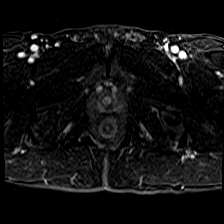
[im 190/532]
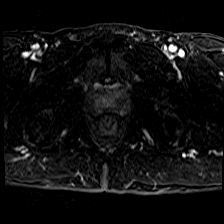
[im 228/532]
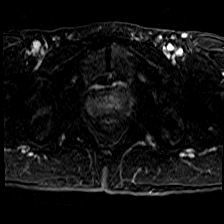
[im 266/532]
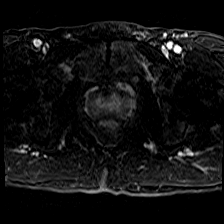
[im 304/532]
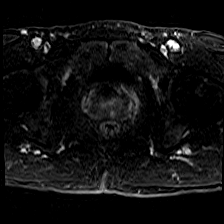
[im 342/532]
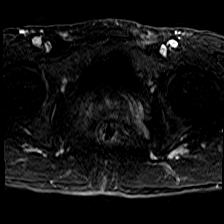
[im 380/532]
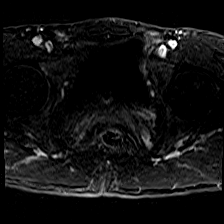
[im 418/532]
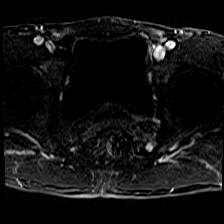
[im 456/532]
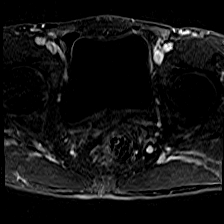
[im 494/532]
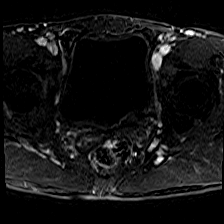
[im 532/532]
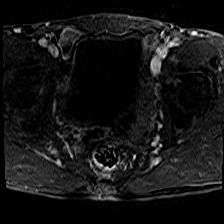

[Series 18: T1 dynamic · axial · delayed · 2.5mm · 1.00mm/px · z∈[-32,+225]mm · 3 of 104 slices shown]
[im 1/104]
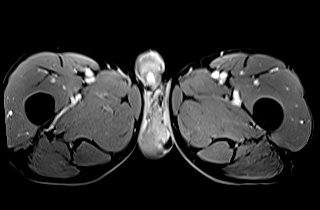
[im 52/104]
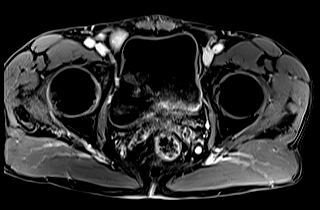
[im 104/104]
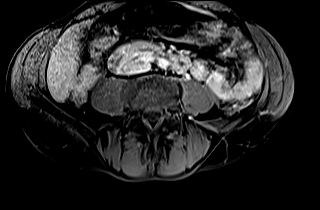

[48 of 48 positions shown; findings below may reference images not displayed]

FINDINGS: Prostate:

Hazy low T2 signal stranding is present diffusely throughout the
peripheral zone. This is likely postinflammatory and is considered
PI-RADS category 2. A component of this is wedge-shaped in the left
peripheral zone.

Region of interest # 1: Small PI-RADS category 4 lesion of the right
posterolateral and anterior peripheral zone in the mid gland and
right anterior peripheral zone in the base, with focally reduced T2
signal (image 37, series 7), mildly restricted diffusion with low
ADC map activity (image 15 of series [DATE]), and mild focal early
enhancement (image 127, series 16). This measures 0.50 cc (1.1 by
0.5 by 1.3 cm).

Region of interest tactic 2: Small PI-RADS category 4 lesion of the
right posterolateral peripheral zone at the apex, with focally
reduced T2 signal (image 50, series 7) and focal early enhancement
(image 160, series 16). This measures 0.24 cc (1.1 by 0.4 by
cm).

Volume: 3D volumetric analysis: Prostate volume 32.72 cc (4.7 by
by 4.4 cm).

Transcapsular spread:  Absent

Seminal vesicle involvement: Absent

Neurovascular bundle involvement: Absent

Pelvic adenopathy: Absent

Bone metastasis: Absent

Other findings: No supplemental non-categorized findings.
IMPRESSION: 1. Two small PI-RADS category 4 lesions in the right peripheral
zone. Targeting data sent to [facility].

## 2023-10-22 ENCOUNTER — Other Ambulatory Visit: Payer: Self-pay | Admitting: Family

## 2023-10-22 DIAGNOSIS — R1031 Right lower quadrant pain: Secondary | ICD-10-CM

## 2023-10-23 ENCOUNTER — Ambulatory Visit
Admission: RE | Admit: 2023-10-23 | Discharge: 2023-10-23 | Disposition: A | Discharge status: Home / Self Care | Source: Ambulatory Visit | Attending: Family | Admitting: Family

## 2023-10-23 DIAGNOSIS — R1031 Right lower quadrant pain: Secondary | ICD-10-CM

## 2024-03-29 ENCOUNTER — Other Ambulatory Visit (HOSPITAL_BASED_OUTPATIENT_CLINIC_OR_DEPARTMENT_OTHER): Payer: Self-pay | Admitting: Adult Health Nurse Practitioner

## 2024-03-29 DIAGNOSIS — M25551 Pain in right hip: Secondary | ICD-10-CM
